# Patient Record
Sex: Female | Born: 1940 | Race: White | Hispanic: No | Marital: Single | Smoking: Never smoker
Health system: Southern US, Community
[De-identification: ages and names within clinical notes are randomized; demographics above are authoritative.]

## PROBLEM LIST (undated history)

## (undated) DIAGNOSIS — I10 Essential (primary) hypertension: Secondary | ICD-10-CM

## (undated) HISTORY — PX: NO PAST SURGERIES: SHX2092

---

## 2001-10-04 ENCOUNTER — Encounter: Payer: Self-pay | Admitting: Family Medicine

## 2001-10-04 ENCOUNTER — Ambulatory Visit (HOSPITAL_COMMUNITY): Admission: RE | Admit: 2001-10-04 | Discharge: 2001-10-04 | Payer: Self-pay | Admitting: Family Medicine

## 2020-10-11 ENCOUNTER — Encounter (HOSPITAL_BASED_OUTPATIENT_CLINIC_OR_DEPARTMENT_OTHER): Payer: Self-pay | Admitting: *Deleted

## 2020-10-11 ENCOUNTER — Emergency Department (HOSPITAL_BASED_OUTPATIENT_CLINIC_OR_DEPARTMENT_OTHER)
Admission: EM | Admit: 2020-10-11 | Discharge: 2020-10-11 | Disposition: A | Discharge status: Home / Self Care | Payer: Medicare Other | Attending: Emergency Medicine | Admitting: Emergency Medicine

## 2020-10-11 ENCOUNTER — Other Ambulatory Visit: Payer: Self-pay

## 2020-10-11 DIAGNOSIS — S0101XA Laceration without foreign body of scalp, initial encounter: Secondary | ICD-10-CM | POA: Diagnosis not present

## 2020-10-11 DIAGNOSIS — W228XXA Striking against or struck by other objects, initial encounter: Secondary | ICD-10-CM | POA: Diagnosis not present

## 2020-10-11 DIAGNOSIS — I10 Essential (primary) hypertension: Secondary | ICD-10-CM | POA: Insufficient documentation

## 2020-10-11 DIAGNOSIS — R0989 Other specified symptoms and signs involving the circulatory and respiratory systems: Secondary | ICD-10-CM | POA: Insufficient documentation

## 2020-10-11 DIAGNOSIS — S0990XA Unspecified injury of head, initial encounter: Secondary | ICD-10-CM | POA: Diagnosis present

## 2020-10-11 DIAGNOSIS — R059 Cough, unspecified: Secondary | ICD-10-CM

## 2020-10-11 HISTORY — DX: Essential (primary) hypertension: I10

## 2020-10-11 MED ORDER — LIDOCAINE-EPINEPHRINE (PF) 2 %-1:200000 IJ SOLN
10.0000 mL | Freq: Once | INTRAMUSCULAR | Status: AC
  Administered 2020-10-11: 10 mL via INTRADERMAL

## 2020-10-11 MED ORDER — DOXYCYCLINE HYCLATE 100 MG PO CAPS: 100.0000 mg | ORAL_CAPSULE | Freq: Two times a day (BID) | ORAL | 0 refills | Status: DC

## 2020-10-11 MED ORDER — LIDOCAINE-EPINEPHRINE (PF) 2 %-1:200000 IJ SOLN
INTRAMUSCULAR | Status: AC
  Filled 2020-10-11: qty 20

## 2020-10-11 NOTE — ED Notes (Signed)
Awaiting wound dressing via Tech to D/C home

## 2020-10-11 NOTE — ED Notes (Signed)
Returned to finish PT dressing but PT not present at this time

## 2020-10-11 NOTE — ED Notes (Signed)
ED Provider at bedside. 

## 2020-10-11 NOTE — ED Triage Notes (Signed)
Laceration to top of her head.  She stated that she hit her head on a granite countertop today.  She denies passing out.

## 2020-10-11 NOTE — ED Provider Notes (Signed)
[facility] EMERGENCY DEPARTMENT Provider Note  CSN: [id number] Arrival date & time: 10/11/20 1718    History Chief Complaint  Patient presents with  . Laceration    HPI  Cathy [name] is a 80 y.o. female with no significant PMH reports she was bending down to pick something up and when she stood up she hit her head on a granite countertop sustaining a large laceration to her scalp. No reported LOC. Complaining of moderate sharp pain. Patient has had a cough recently, tested negative for Covid, completed antibiotics without much improvement. No fever.    Past Medical History:  Diagnosis Date  . Hypertension     History reviewed. No pertinent surgical history.  No family history on file.  Social History   Tobacco Use  . Smoking status: Never Smoker  . Smokeless tobacco: Never Used  Substance Use Topics  . Alcohol use: Yes    Alcohol/week: 2.0 standard drinks    Types: 2 Glasses of wine per week    Comment: per day  . Drug use: Never     Home Medications Prior to Admission medications   Not on File     Allergies    Sulfa antibiotics   Review of Systems   Review of Systems A comprehensive review of systems was completed and negative except as noted in HPI.    Physical Exam BP 126/82 (BP Location: Left Arm)   Pulse 82   Temp 98 F (36.7 C) (Oral)   Resp 16   Ht 4\' 8"  (1.422 m)   Wt 54 kg   SpO2 97%   BMI 26.68 kg/m   Physical Exam Vitals and nursing note reviewed.  HENT:     Head: Normocephalic.     Comments: Large 18cm laceration to parietal scalp down to the cranium    Nose: Nose normal.  Eyes:     Extraocular Movements: Extraocular movements intact.  Pulmonary:     Effort: Pulmonary effort is normal.     Breath sounds: Rhonchi present.  Musculoskeletal:        General: Normal range of motion.     Cervical back: Neck supple.  Skin:    Findings: No rash (on exposed skin).  Neurological:     Mental Status: She is alert  and oriented to person, place, and time.  Psychiatric:        Mood and Affect: Mood normal.      ED Results / Procedures / Treatments   Labs (all labs ordered are listed, but only abnormal results are displayed) Labs Reviewed - No data to display  EKG None   Radiology No results found.  Procedures . Laceration Repair  Date/Time: 10/11/2020 7:26 PM Performed by: 10/13/2020, MD Authorized by: Pollyann Savoy, MD   Consent:    Consent obtained:  Verbal   Consent given by:  Patient   Risks discussed:  Infection, poor cosmetic result and poor wound healing Universal protocol:    Patient identity confirmed:  Verbally with patient Anesthesia:    Anesthesia method:  Local infiltration   Local anesthetic:  Lidocaine 2% WITH epi Laceration details:    Location:  Scalp   Scalp location:  Crown   Length (cm):  18 Pre-procedure details:    Preparation:  Patient was prepped and draped in usual sterile fashion Exploration:    Hemostasis achieved with:  Epinephrine   Wound exploration: entire depth of wound visualized     Wound extent comment:  Through galea Treatment:    Area cleansed with:  Saline   Irrigation solution:  Sterile water   Irrigation volume:  200   Irrigation method:  Pressure wash   Visualized foreign bodies/material removed: no     Layers/structures repaired:  Vernona Rieger:    Suture size:  4-0   Suture material:  Vicryl   Suture technique:  Simple interrupted   Number of sutures:  2 Skin repair:    Repair method:  Staples   Number of staples:  15 Approximation:    Approximation:  Close Repair type:    Repair type:  Intermediate Post-procedure details:    Dressing:  Non-adherent dressing   Procedure completion:  Tolerated well, no immediate complications    Medications Ordered in the ED Medications - No data to display   MDM Rules/Calculators/A&P MDM Large scalp wound. Given depth and extent of wound plan oral Abx. Given wound  care and follow up instructions.   ED Course  I have reviewed the triage vital signs and the nursing notes.  Pertinent labs & imaging results that were available during my care of the patient were reviewed by me and considered in my medical decision making (see chart for details).     Final Clinical Impression(s) / ED Diagnoses Final diagnoses:  Laceration of scalp, initial encounter    Rx / DC Orders ED Discharge Orders    None       Pollyann Savoy, MD 10/11/20 1934

## 2021-02-18 ENCOUNTER — Emergency Department (HOSPITAL_COMMUNITY): Payer: Medicare Other

## 2021-02-18 ENCOUNTER — Inpatient Hospital Stay (HOSPITAL_COMMUNITY)
Admission: EM | Admit: 2021-02-18 | Discharge: 2021-02-20 | DRG: 641 | Disposition: A | Discharge status: Home / Self Care | Payer: Medicare Other | Attending: Family Medicine | Admitting: Family Medicine

## 2021-02-18 DIAGNOSIS — S32810A Multiple fractures of pelvis with stable disruption of pelvic ring, initial encounter for closed fracture: Secondary | ICD-10-CM | POA: Diagnosis present

## 2021-02-18 DIAGNOSIS — E871 Hypo-osmolality and hyponatremia: Principal | ICD-10-CM | POA: Diagnosis present

## 2021-02-18 DIAGNOSIS — R262 Difficulty in walking, not elsewhere classified: Secondary | ICD-10-CM | POA: Diagnosis present

## 2021-02-18 DIAGNOSIS — I890 Lymphedema, not elsewhere classified: Secondary | ICD-10-CM

## 2021-02-18 DIAGNOSIS — Y92019 Unspecified place in single-family (private) house as the place of occurrence of the external cause: Secondary | ICD-10-CM

## 2021-02-18 DIAGNOSIS — L89151 Pressure ulcer of sacral region, stage 1: Secondary | ICD-10-CM | POA: Diagnosis present

## 2021-02-18 DIAGNOSIS — E875 Hyperkalemia: Secondary | ICD-10-CM | POA: Diagnosis present

## 2021-02-18 DIAGNOSIS — R55 Syncope and collapse: Secondary | ICD-10-CM | POA: Diagnosis present

## 2021-02-18 DIAGNOSIS — S32599A Other specified fracture of unspecified pubis, initial encounter for closed fracture: Secondary | ICD-10-CM | POA: Diagnosis not present

## 2021-02-18 DIAGNOSIS — I10 Essential (primary) hypertension: Secondary | ICD-10-CM | POA: Diagnosis present

## 2021-02-18 DIAGNOSIS — L89159 Pressure ulcer of sacral region, unspecified stage: Secondary | ICD-10-CM | POA: Diagnosis not present

## 2021-02-18 DIAGNOSIS — D649 Anemia, unspecified: Secondary | ICD-10-CM | POA: Diagnosis present

## 2021-02-18 DIAGNOSIS — Z79899 Other long term (current) drug therapy: Secondary | ICD-10-CM | POA: Diagnosis not present

## 2021-02-18 DIAGNOSIS — Z20822 Contact with and (suspected) exposure to covid-19: Secondary | ICD-10-CM | POA: Diagnosis present

## 2021-02-18 DIAGNOSIS — L89312 Pressure ulcer of right buttock, stage 2: Secondary | ICD-10-CM | POA: Diagnosis present

## 2021-02-18 DIAGNOSIS — R222 Localized swelling, mass and lump, trunk: Secondary | ICD-10-CM

## 2021-02-18 DIAGNOSIS — S2242XA Multiple fractures of ribs, left side, initial encounter for closed fracture: Secondary | ICD-10-CM | POA: Diagnosis not present

## 2021-02-18 DIAGNOSIS — L89322 Pressure ulcer of left buttock, stage 2: Secondary | ICD-10-CM | POA: Diagnosis present

## 2021-02-18 DIAGNOSIS — Z882 Allergy status to sulfonamides status: Secondary | ICD-10-CM | POA: Diagnosis not present

## 2021-02-18 DIAGNOSIS — S32591D Other specified fracture of right pubis, subsequent encounter for fracture with routine healing: Secondary | ICD-10-CM | POA: Diagnosis not present

## 2021-02-18 DIAGNOSIS — E861 Hypovolemia: Secondary | ICD-10-CM | POA: Diagnosis present

## 2021-02-18 DIAGNOSIS — W07XXXA Fall from chair, initial encounter: Secondary | ICD-10-CM | POA: Diagnosis present

## 2021-02-18 DIAGNOSIS — S2232XA Fracture of one rib, left side, initial encounter for closed fracture: Secondary | ICD-10-CM | POA: Diagnosis present

## 2021-02-18 DIAGNOSIS — R609 Edema, unspecified: Secondary | ICD-10-CM | POA: Diagnosis not present

## 2021-02-18 LAB — URINALYSIS, ROUTINE W REFLEX MICROSCOPIC
Bilirubin Urine: NEGATIVE
Glucose, UA: NEGATIVE mg/dL
Hgb urine dipstick: NEGATIVE
Ketones, ur: NEGATIVE mg/dL
Nitrite: NEGATIVE
Protein, ur: NEGATIVE mg/dL
Specific Gravity, Urine: 1.008 (ref 1.005–1.030)
pH: 6 (ref 5.0–8.0)

## 2021-02-18 LAB — COMPREHENSIVE METABOLIC PANEL
ALT: 19 U/L (ref 0–44)
AST: 32 U/L (ref 15–41)
Albumin: 3.5 g/dL (ref 3.5–5.0)
Alkaline Phosphatase: 133 U/L — ABNORMAL HIGH (ref 38–126)
Anion gap: 6 (ref 5–15)
BUN: 17 mg/dL (ref 8–23)
CO2: 24 mmol/L (ref 22–32)
Calcium: 8.9 mg/dL (ref 8.9–10.3)
Chloride: 93 mmol/L — ABNORMAL LOW (ref 98–111)
Creatinine, Ser: 0.94 mg/dL (ref 0.44–1.00)
GFR, Estimated: >60 mL/min (ref >60)
Glucose, Bld: 94 mg/dL (ref 70–99)
Potassium: 5.2 mmol/L — ABNORMAL HIGH (ref 3.5–5.1)
Sodium: 123 mmol/L — ABNORMAL LOW (ref 135–145)
Total Bilirubin: 0.7 mg/dL (ref 0.3–1.2)
Total Protein: 7.4 g/dL (ref 6.5–8.1)

## 2021-02-18 LAB — CBC WITH DIFFERENTIAL/PLATELET
Abs Immature Granulocytes: 0.06 10*3/uL (ref 0.00–0.07)
Basophils Absolute: 0.1 10*3/uL (ref 0.0–0.1)
Basophils Relative: 1 %
Eosinophils Absolute: 0.1 10*3/uL (ref 0.0–0.5)
Eosinophils Relative: 2 %
HCT: 34.9 % — ABNORMAL LOW (ref 36.0–46.0)
Hemoglobin: 11.7 g/dL — ABNORMAL LOW (ref 12.0–15.0)
Immature Granulocytes: 1 %
Lymphocytes Relative: 20 %
Lymphs Abs: 1.2 10*3/uL (ref 0.7–4.0)
MCH: 30.5 pg (ref 26.0–34.0)
MCHC: 33.5 g/dL (ref 30.0–36.0)
MCV: 90.9 fL (ref 80.0–100.0)
Monocytes Absolute: 0.8 10*3/uL (ref 0.1–1.0)
Monocytes Relative: 14 %
Neutro Abs: 3.8 10*3/uL (ref 1.7–7.7)
Neutrophils Relative %: 62 %
Platelets: 294 10*3/uL (ref 150–400)
RBC: 3.84 MIL/uL — ABNORMAL LOW (ref 3.87–5.11)
RDW: 12.3 % (ref 11.5–15.5)
WBC: 6 10*3/uL (ref 4.0–10.5)
nRBC: 0 % (ref 0.0–0.2)

## 2021-02-18 LAB — BASIC METABOLIC PANEL
Anion gap: 6 (ref 5–15)
BUN: 14 mg/dL (ref 8–23)
CO2: 25 mmol/L (ref 22–32)
Calcium: 8.7 mg/dL — ABNORMAL LOW (ref 8.9–10.3)
Chloride: 94 mmol/L — ABNORMAL LOW (ref 98–111)
Creatinine, Ser: 0.87 mg/dL (ref 0.44–1.00)
GFR, Estimated: >60 mL/min (ref >60)
Glucose, Bld: 126 mg/dL — ABNORMAL HIGH (ref 70–99)
Potassium: 5 mmol/L (ref 3.5–5.1)
Sodium: 125 mmol/L — ABNORMAL LOW (ref 135–145)

## 2021-02-18 LAB — RESP PANEL BY RT-PCR (FLU A&B, COVID) ARPGX2
Influenza A by PCR: NEGATIVE
Influenza B by PCR: NEGATIVE
SARS Coronavirus 2 by RT PCR: NEGATIVE

## 2021-02-18 LAB — D-DIMER, QUANTITATIVE: D-Dimer, Quant: 2.33 ug/mL-FEU — ABNORMAL HIGH (ref 0.00–0.50)

## 2021-02-18 LAB — MAGNESIUM: Magnesium: 2 mg/dL (ref 1.7–2.4)

## 2021-02-18 LAB — RAPID URINE DRUG SCREEN, HOSP PERFORMED
Amphetamines: NOT DETECTED
Barbiturates: NOT DETECTED
Benzodiazepines: NOT DETECTED
Cocaine: NOT DETECTED
Opiates: NOT DETECTED
Tetrahydrocannabinol: NOT DETECTED

## 2021-02-18 LAB — TROPONIN I (HIGH SENSITIVITY): Troponin I (High Sensitivity): 5 ng/L (ref <18)

## 2021-02-18 LAB — ACETAMINOPHEN LEVEL: Acetaminophen (Tylenol), Serum: <10 ug/mL — ABNORMAL LOW (ref 10–30)

## 2021-02-18 LAB — C-REACTIVE PROTEIN: CRP: 0.5 mg/dL (ref <1.0)

## 2021-02-18 LAB — CBG MONITORING, ED: Glucose-Capillary: 108 mg/dL — ABNORMAL HIGH (ref 70–99)

## 2021-02-18 MED ORDER — ACETAMINOPHEN 325 MG PO TABS
650.0000 mg | ORAL_TABLET | Freq: Four times a day (QID) | ORAL | Status: DC | PRN
  Administered 2021-02-18: 650 mg via ORAL
  Filled 2021-02-18: qty 2

## 2021-02-18 MED ORDER — ACETAMINOPHEN 650 MG RE SUPP: 650.0000 mg | Freq: Four times a day (QID) | RECTAL | Status: DC | PRN

## 2021-02-18 MED ORDER — METOPROLOL SUCCINATE ER 25 MG PO TB24
25.0000 mg | ORAL_TABLET | Freq: Every day | ORAL | Status: DC
  Administered 2021-02-19 – 2021-02-20 (×2): 25 mg via ORAL
  Filled 2021-02-18 (×2): qty 1

## 2021-02-18 MED ORDER — METOPROLOL SUCCINATE 25 MG PO CS24: 1.0000 | EXTENDED_RELEASE_CAPSULE | Freq: Every day | ORAL | Status: DC

## 2021-02-18 MED ORDER — ONDANSETRON HCL 4 MG PO TABS: 4.0000 mg | ORAL_TABLET | Freq: Four times a day (QID) | ORAL | Status: DC | PRN

## 2021-02-18 MED ORDER — LISINOPRIL 20 MG PO TABS
20.0000 mg | ORAL_TABLET | Freq: Every day | ORAL | Status: DC
  Administered 2021-02-19 – 2021-02-20 (×2): 20 mg via ORAL
  Filled 2021-02-18 (×2): qty 1

## 2021-02-18 MED ORDER — ALBUTEROL SULFATE (2.5 MG/3ML) 0.083% IN NEBU
2.5000 mg | INHALATION_SOLUTION | Freq: Four times a day (QID) | RESPIRATORY_TRACT | Status: DC
  Administered 2021-02-18: 2.5 mg via RESPIRATORY_TRACT
  Filled 2021-02-18: qty 3

## 2021-02-18 MED ORDER — LISINOPRIL 20 MG PO TABS: 20.0000 mg | ORAL_TABLET | Freq: Every day | ORAL | Status: DC

## 2021-02-18 MED ORDER — SODIUM CHLORIDE 0.9 % IV SOLN: INTRAVENOUS | Status: DC

## 2021-02-18 MED ORDER — ENOXAPARIN SODIUM 40 MG/0.4ML IJ SOSY
40.0000 mg | PREFILLED_SYRINGE | INTRAMUSCULAR | Status: DC
  Administered 2021-02-18 – 2021-02-19 (×2): 40 mg via SUBCUTANEOUS
  Filled 2021-02-18 (×2): qty 0.4

## 2021-02-18 MED ORDER — HYDROCODONE-ACETAMINOPHEN 5-325 MG PO TABS: 1.0000 | ORAL_TABLET | Freq: Four times a day (QID) | ORAL | Status: DC | PRN

## 2021-02-18 MED ORDER — ONDANSETRON HCL 4 MG/2ML IJ SOLN: 4.0000 mg | Freq: Four times a day (QID) | INTRAMUSCULAR | Status: DC | PRN

## 2021-02-18 NOTE — H&P (Signed)
History and Physical    Cathy [name] XLK:[id number] DOB: 11/17/1940 DOA: 02/18/2021  Referring MD/NP/PA: Louretta ParmaKendall Stewardson, DO (resident)  PCP: Darrin [facility], [facility] [place]  [place] [facility]  Chief Complaint: Dizziness  I have personally briefly reviewed patient[facility]   HPI: Cathy AlexanderMargaret C Corkery is a [age] y.o. female with medical history significant of hypertension and lymphedema who presents with complaints of dizziness.  Symptoms started after she had had breakfast this morning and she felt lightheaded.  Denies having any loss of consciousness, chest pain, or palpations.  Patient admits that she had slipped off of the edge of the couch approximately 2- 3 weeks ago and landed on her tailbone.  Denied having any loss of consciousness and she was not evaluated for this.  Since that time patient reports that her tailbone has been hurting her and she was having pain with walking using her walker.  Patient has been using Tylenol every 6 hours as she still help with symptoms.  Notes that she is very sensitive to any kind of pain medications and has been doing fine with just Tylenol.  She has lymphedema, but reported that her legs appeared more swollen here recently.  Son reports that since she had fallen she is not getting up and moving around as much and have been sitting on a heating pad most of the day. There have been reports of patient being confused, but upon EMS arrival patient was alert and oriented x4.  ED Course: Upon admission to the emergency department patient was noted to be afebrile with respirations 15-24, and all other vital signs relatively maintained.  Labs significant for hemoglobin 11.7, sodium 123, potassium 5.2, BUN 17, creatinine 0.94, alkaline phosphatase 133, and high-sensitivity troponin 5.  Influenza and COVID-19 screening was negative.  Orthopedics have been consulted.  TRH called to admit.  Review of  Systems  Constitutional: Negative for diaphoresis and fever.  HENT: Negative for nosebleeds and sinus pain.   Eyes: Negative for photophobia and pain.  Respiratory: Negative for cough and shortness of breath.   Cardiovascular: Positive for leg swelling. Negative for chest pain.  Gastrointestinal: Negative for abdominal pain, nausea and vomiting.  Genitourinary: Negative for dysuria and hematuria.  Musculoskeletal: Positive for falls and joint pain.  Skin: Negative for rash.  Neurological: Positive for dizziness. Negative for loss of consciousness and headaches.  Psychiatric/Behavioral: Negative for memory loss and substance abuse.    Past Medical History:  Diagnosis Date  . Hypertension     No past surgical history on file.   reports that she has never smoked. She has never used smokeless tobacco. She reports current alcohol use of about 2.0 standard drinks of alcohol per week. She reports that she does not use drugs.  Allergies  Allergen Reactions  . Sulfa Antibiotics Hives  . Sulfamethoxazole-Trimethoprim Rash    No family history on file.  Prior to Admission medications   Medication Sig Start Date End Date Taking? Authorizing Provider  furosemide (LASIX) 20 MG tablet Take 20 mg by mouth every other day. 01/20/21  Yes [provider]  KAPSPARGO SPRINKLE 25 MG CS24 Take 1 capsule by mouth daily. 10/31/20  Yes [provider]  lisinopril (ZESTRIL) 20 MG tablet Take 1 tablet by mouth daily. 12/22/20  Yes [provider]  potassium chloride (KLOR-CON) 10 MEQ tablet Take 10 mEq by mouth daily. 12/15/20  Yes [provider]  doxycycline (VIBRAMYCIN) 100 MG capsule  Take 1 capsule (100 mg total) by mouth 2 (two) times daily. Patient not taking: Reported on 02/18/2021 10/11/20   Pollyann Savoy, MD    Physical Exam:  Constitutional: elderly NAD, calm, comfortable Vitals:   02/18/21 1335 02/18/21 1345 02/18/21 1500 02/18/21 1515  BP: (!) 115/59  125/79 (!) 153/91 (!) 160/73  Pulse: 64 72 76 74  Resp: (!) 23 (!) Temp:      TempSrc:      SpO2: 99% 100% 100% 100%  Weight:      Height:       Eyes: PERRL, lids and conjunctivae normal ENMT: Mucous membranes are moist. Posterior pharynx clear of any exudate or lesions.Normal dentition.  Neck: normal, supple, no masses, no thyromegaly Respiratory: clear to auscultation bilaterally, no wheezing, no crackles. Normal respiratory effort. No accessory muscle use.  Cardiovascular: Regular rate and rhythm, no murmurs / rubs / gallops. No extremity edema. 2+ pedal pulses. No carotid bruits.  Abdomen: no tenderness, no masses palpated. No hepatosplenomegaly. Bowel sounds positive.  Musculoskeletal: no clubbing / cyanosis. No joint deformity upper and lower extremities. Good ROM, no contractures. Normal muscle tone.  Skin: no rashes, lesions, ulcers. No induration Neurologic: CN 2-12 grossly intact. Sensation intact, DTR normal. Strength 5/5 in all 4.  Psychiatric: Normal judgment and insight. Alert and oriented x 3. Normal mood.     Labs on Admission: I have personally reviewed following labs and imaging studies  CBC: Recent Labs  Lab 02/18/21 1217  WBC 6.0  NEUTROABS 3.8  HGB 11.7*  HCT 34.9*  MCV 90.9  PLT 294   Basic Metabolic Panel: Recent Labs  Lab 02/18/21 1217  NA 123*  K 5.2*  CL 93*  CO2 24  GLUCOSE 94  BUN 17  CREATININE 0.94  CALCIUM 8.9  MG 2.0   GFR: Estimated Creatinine Clearance: 36 mL/min (by C-G formula based on SCr of 0.94 mg/dL). Liver Function Tests: Recent Labs  Lab 02/18/21 1217  AST 32  ALT 19  ALKPHOS 133*  BILITOT 0.7  PROT 7.4  ALBUMIN 3.5   No results for input(s): LIPASE, AMYLASE in the last 168 hours. No results for input(s): AMMONIA in the last 168 hours. Coagulation Profile: No results for input(s): INR, PROTIME in the last 168 hours. Cardiac Enzymes: No results for input(s): CKTOTAL, CKMB, CKMBINDEX, TROPONINI in the  last 168 hours. BNP (last 3 results) No results for input(s): PROBNP in the last 8760 hours. HbA1C: No results for input(s): HGBA1C in the last 72 hours. CBG: Recent Labs  Lab 02/18/21 1203  GLUCAP 108*   Lipid Profile: No results for input(s): CHOL, HDL, LDLCALC, TRIG, CHOLHDL, LDLDIRECT in the last 72 hours. Thyroid Function Tests: No results for input(s): TSH, T4TOTAL, FREET4, T3FREE, THYROIDAB in the last 72 hours. Anemia Panel: No results for input(s): VITAMINB12, FOLATE, FERRITIN, TIBC, IRON, RETICCTPCT in the last 72 hours. Urine analysis: No results found for: COLORURINE, APPEARANCEUR, LABSPEC, PHURINE, GLUCOSEU, HGBUR, BILIRUBINUR, KETONESUR, PROTEINUR, UROBILINOGEN, NITRITE, LEUKOCYTESUR Sepsis Labs: Recent Results (from the past 240 hour(s))  Resp Panel by RT-PCR (Flu A&B, Covid) Nasopharyngeal Swab     Status: None   Collection Time: 02/18/21 12:27 PM   Specimen: Nasopharyngeal Swab; Nasopharyngeal(NP) swabs in vial transport medium  Result Value Ref Range Status   SARS Coronavirus 2 by RT PCR NEGATIVE NEGATIVE Final    Comment: (NOTE) SARS-CoV-2 target nucleic acids are NOT DETECTED.  The SARS-CoV-2 RNA is generally detectable in upper respiratory specimens  during the acute phase of infection. The lowest concentration of SARS-CoV-2 viral copies this assay can detect is 138 copies/mL. A negative result does not preclude SARS-Cov-2 infection and should not be used as the sole basis for treatment or other patient management decisions. A negative result may occur with  improper specimen collection/handling, submission of specimen other than nasopharyngeal swab, presence of viral mutation(s) within the areas targeted by this assay, and inadequate number of viral copies(<138 copies/mL). A negative result must be combined with clinical observations, patient history, and epidemiological information. The expected result is Negative.  Fact Sheet for Patients:   BloggerCourse.com  Fact Sheet for Healthcare Providers:  SeriousBroker.it  This test is no t yet approved or cleared by the [place] FDA and  has been authorized for detection and/or diagnosis of SARS-CoV-2 by FDA under an Emergency Use Authorization (EUA). This EUA will remain  in effect (meaning this test can be used) for the duration of the COVID-19 declaration under Section 564(b)(1) of the Act, 21 U.S.C.section 360bbb-3(b)(1), unless the authorization is terminated  or revoked sooner.       Influenza A by PCR NEGATIVE NEGATIVE Final   Influenza B by PCR NEGATIVE NEGATIVE Final    Comment: (NOTE) The Xpert Xpress SARS-CoV-2/FLU/RSV plus assay is intended as an aid in the diagnosis of influenza from Nasopharyngeal swab specimens and should not be used as a sole basis for treatment. Nasal washings and aspirates are unacceptable for Xpert Xpress SARS-CoV-2/FLU/RSV testing.  Fact Sheet for Patients: BloggerCourse.com  Fact Sheet for Healthcare Providers: SeriousBroker.it  This test is not yet approved or cleared by the [place] FDA and has been authorized for detection and/or diagnosis of SARS-CoV-2 by FDA under an Emergency Use Authorization (EUA). This EUA will remain in effect (meaning this test can be used) for the duration of the COVID-19 declaration under Section 564(b)(1) of the Act, 21 U.S.C. section 360bbb-3(b)(1), unless the authorization is terminated or revoked.  Performed at [facility], [address] [place], [place] [address]      Radiological Exams on Admission: CT Head Wo Contrast  Result Date: 02/18/2021 CLINICAL DATA:  Mental status change EXAM: CT HEAD WITHOUT CONTRAST TECHNIQUE: Contiguous axial images were obtained from the base of the skull through the vertex without intravenous contrast. COMPARISON:  None. FINDINGS: Brain:  Moderate cerebral atrophy. Ventricular enlargement consistent with the level of atrophy. Moderate white matter hypodensity bilaterally is symmetric. Negative for acute infarct, acute hemorrhage, mass Vascular: Negative for hyperdense vessel Skull: Negative Sinuses/Orbits: The paranasal sinuses are clear.  Negative orbit Other: None IMPRESSION: No acute abnormality Moderate atrophy and moderate chronic microvascular ischemic changes in the white matter Electronically Signed   By: Marlan Palau M.D.   On: 02/18/2021 13:34   DG Pelvis Portable  Result Date: 02/18/2021 CLINICAL DATA:  Fall 2 weeks ago EXAM: PORTABLE PELVIS 1-2 VIEWS COMPARISON:  None. FINDINGS: Moderately displaced fracture of the right superior pubic ramus and parasymphyseal aspect of the right pubic bone. Nondisplaced fracture of the right inferior pubic ramus. Possible nondisplaced fracture involving the parasymphyseal aspect of the left pubic bone. Pubic symphysis intact without diastasis. SI joints appear intact without diastasis. No hip fracture or dislocation. IMPRESSION: 1. Moderately displaced fracture of the right superior pubic ramus and parasymphyseal aspect of the right pubic bone. 2. Nondisplaced fracture of the right inferior pubic ramus. 3. Possible nondisplaced fracture involving the parasymphyseal aspect of the left pubic bone. Electronically Signed   By: Janyth Pupa  Plundo D.O.   On: 02/18/2021 12:55   DG Chest Portable 1 View  Result Date: 02/18/2021 CLINICAL DATA:  Lightheadedness. EXAM: PORTABLE CHEST 1 VIEW COMPARISON:  None. FINDINGS: The heart size and mediastinal contours are within normal limits. Masslike opacity at the right lung apex may be secondary to overlap of the patient's head/neck in this region. Otherwise, no consolidation. No visible pleural effusions or pneumothorax on the single AP semi erect radiograph. No acute osseous abnormality. IMPRESSION: 1. Masslike opacity at the right lung apex may be secondary to  overlap of the patient's head/neck in this region; however, recommend dedicated PA and lateral radiographs with extension of the neck to better evaluate this area and exclude a mass. 2. Otherwise, no acute cardiopulmonary disease. Electronically Signed   By: Feliberto Harts MD   On: 02/18/2021 13:00   CT CHEST ABDOMEN PELVIS WO CONTRAST  Result Date: 02/18/2021 CLINICAL DATA:  Fall.  Dizziness. EXAM: CT CHEST, ABDOMEN AND PELVIS WITHOUT CONTRAST TECHNIQUE: Multidetector CT imaging of the chest, abdomen and pelvis was performed following the standard protocol without IV contrast. COMPARISON:  Radiograph of same day. FINDINGS: CT CHEST FINDINGS Cardiovascular: Atherosclerosis of thoracic aorta is noted without aneurysm formation. Normal cardiac size. No pericardial effusion. Mild coronary artery calcifications are noted. Mediastinum/Nodes: Small sliding-type hiatal hernia is noted. No adenopathy is noted. Thyroid gland is unremarkable. Lungs/Pleura: Lungs are clear. No pleural effusion or pneumothorax. Musculoskeletal: Minimally displaced fractures are seen involving the lateral portion of the left seventh and eighth ribs. CT ABDOMEN PELVIS FINDINGS Hepatobiliary: No focal liver abnormality is seen. No gallstones, gallbladder wall thickening, or biliary dilatation. Pancreas: Unremarkable. No pancreatic ductal dilatation or surrounding inflammatory changes. Spleen: Normal in size without focal abnormality. Adrenals/Urinary Tract: Adrenal glands are unremarkable. Kidneys are normal, without renal calculi, focal lesion, or hydronephrosis. Bladder is unremarkable. Stomach/Bowel: Stomach is within normal limits. Appendix appears normal. No evidence of bowel wall thickening, distention, or inflammatory changes. Sigmoid diverticulosis is noted without inflammation. Vascular/Lymphatic: Aortic atherosclerosis. No enlarged abdominal or pelvic lymph nodes. Reproductive: Uterus and bilateral adnexa are unremarkable. Other:  No abdominal wall hernia or abnormality. No abdominopelvic ascites. Musculoskeletal: Mildly displaced fractures are seen involving the right superior and inferior pubic rami. Nondisplaced left parasymphyseal pubic fracture is noted. IMPRESSION: Minimally displaced fractures are seen involving the left seventh and eighth ribs. Mildly displaced fractures are seen involving the right superior and inferior pubic rami. Nondisplaced left parasymphyseal pubic fracture is noted. Small sliding-type hiatal hernia. Mild coronary artery calcifications are noted. Sigmoid diverticulosis is noted without inflammation. Aortic Atherosclerosis (ICD10-I70.0). Electronically Signed   By: Lupita Raider M.D.   On: 02/18/2021 14:40    EKG: Independently reviewed.  Sinus rhythm at 77 bpm  Assessment/Plan Hyponatremia: Acute.  Patient presents with sodium of 123.  She is on furosemide every other day.  Suspecting a hypovolemic hyponatremia as cause of patient's symptoms. -Admit to medical -Hold furosemide -Check urine sodium and urine osmolarity -Normal saline IV fluids at 75 mL/h -Goal correction no more than 10 mmol/L in a 24-hour period  -Adjust IV fluids as needed  Pubic ramus and rib fractures secondary to fall: Patient had fallen approximately 2 weeks ago and had been having difficulty getting around since that time.  Found to have minimally displaced fractures involving seventh and eighth rib, right superior and inferior pubic rami, nondisplaced left symphyseal pubic fracture.  Patient had only been taking Tylenol as needed at home and reports having serious reactions to any  other pain medication.  The patient's son had already arranged for the patient to have [facility] come in for physical therapy.  Orthopedics had been consulted and recommended weightbearing as tolerated to follow-up with Dr. Aundria Rud in the office in 3 weeks. -Check acetaminophen level -Incentive spirometry -Tylenol as needed for  pain -PT/OT to eval and treat in a.m. -Social work consulted   Hyperkalemia: Acute.  Initial potassium mildly elevated at 5.3. -Hold potassium supplement -Normal saline IV fluids as noted above -Recheck sodium levels  Normocytic anemia: Hemoglobin 11.7 g/dL.  Patient reports no complaints of bleeding. -Recheck CBC in a.m.  Lymphedema: Chronic.  Patient reports a chronic history of lymphedema, but notes that he has been more swollen recently.  She had been more sedentary following her fall. -Check CRP and D-dimer -Consider need of venous Doppler ultrasound bilateral lower extremity  Essential hypertension: Blood pressure currently stable. -Continue metoprolol and lisinopril tomorrow morning.  Consider holding lisinopril if potassium levels continue to rise  Stage I sacral pressure ulcer -Low-air-loss mattress replacement -Pressure dressing to sacrum  DVT prophylaxis: lovenox   Code Status:Full Family Communication: Son updated over the phone Disposition Plan: TBD  Consults called: Orthopedics Admission status: inpatient, require more than 2 midnight stay for correction of hyponatremia.  Clydie Braun MD [facility]   If 7PM-7AM, please contact night-coverage   02/18/2021, 3:22 PM

## 2021-02-18 NOTE — ED Provider Notes (Signed)
[facility] EMERGENCY DEPARTMENT Provider Note   CSN: [id number] Arrival date & time: 02/18/21  1142     History Chief Complaint  Patient presents with  . Dizziness  . Altered Mental Status    KARLEIGH BUNTE is a 80 y.o. female.  HPI 80 year old female with history of hypertension presents the emergency department for evaluation after lightheadedness and confusion.  She states she was sitting to eat breakfast, after she was done she felt like she was going to pass out.  Did not feel like the room was spinning.  States this lasted for about an hour, and is now mostly resolved.  Denies history of anything of this previously.  Nothing made his symptoms better, nothing made it worse.  Did not take anything for the symptoms.  States it was moderate lightheadedness.  Denies loss of consciousness, chest pain, shortness of breath, fever, cough, urinary symptoms, abdominal pain, bowel symptoms.  Patient does report falling out of a chair 2 weeks ago, denies hitting her head.  States she fell onto her tailbone and has really had trouble walking with her walker since that time.  States it hurts worst over her sacrum, like when she broke her tailbone.  EMS did find a wound that patient did not know she had overlying the area.  Per EMS, facility reported that she was slower to answer and a little bit more confused than normal.  Baseline is usually able to answer all questions.  Currently, she is alert and oriented x3, though did take her meant to remember what year it was.    Past Medical History:  Diagnosis Date  . Hypertension     There are no problems to display for this patient.   No past surgical history on file.   OB History    Gravida  2   Para  2   Term      Preterm      AB      Living        SAB      IAB      Ectopic      Multiple      Live Births              No family history on file.  Social History   Tobacco Use  . Smoking  status: Never Smoker  . Smokeless tobacco: Never Used  Substance Use Topics  . Alcohol use: Yes    Alcohol/week: 2.0 standard drinks    Types: 2 Glasses of wine per week    Comment: per day  . Drug use: Never    Home Medications Prior to Admission medications   Medication Sig Start Date End Date Taking? Authorizing Provider  furosemide (LASIX) 20 MG tablet Take 20 mg by mouth every other day. 01/20/21  Yes [provider]  KAPSPARGO SPRINKLE 25 MG CS24 Take 1 capsule by mouth daily. 10/31/20  Yes [provider]  lisinopril (ZESTRIL) 20 MG tablet Take 1 tablet by mouth daily. 12/22/20  Yes [provider]  potassium chloride (KLOR-CON) 10 MEQ tablet Take 10 mEq by mouth daily. 12/15/20  Yes [provider]  doxycycline (VIBRAMYCIN) 100 MG capsule Take 1 capsule (100 mg total) by mouth 2 (two) times daily. Patient not taking: Reported on 02/18/2021 10/11/20   Pollyann Savoy, MD    Allergies    Sulfa antibiotics and Sulfamethoxazole-trimethoprim  Review of Systems   Review of Systems  Constitutional: Negative for  chills and fever.  HENT: Negative for ear pain and sore throat.   Eyes: Negative for pain and visual disturbance.  Respiratory: Negative for cough and shortness of breath.   Cardiovascular: Negative for chest pain and palpitations.  Gastrointestinal: Negative for abdominal pain and vomiting.  Genitourinary: Negative for dysuria and hematuria.  Musculoskeletal: Positive for arthralgias. Negative for back pain.  Skin: Positive for wound. Negative for color change and rash.  Neurological: Positive for light-headedness. Negative for seizures and syncope.  Psychiatric/Behavioral: Negative for sleep disturbance.  All other systems reviewed and are negative.   Physical Exam Updated Vital Signs BP (!) 160/73   Pulse 74   Temp (!) 97.5 F (36.4 C) (Oral)   Resp 19   Ht 5' (1.524 m)   Wt 54.4 kg   SpO2 100%   BMI 23.44 kg/m   Physical  Exam Vitals and nursing note reviewed.  Constitutional:      General: She is not in acute distress.    Appearance: She is well-developed.  HENT:     Head: Normocephalic and atraumatic.     Right Ear: External ear normal.     Left Ear: External ear normal.     Nose: Nose normal.     Mouth/Throat:     Mouth: Mucous membranes are moist.  Eyes:     Extraocular Movements: Extraocular movements intact.     Conjunctiva/sclera: Conjunctivae normal.     Pupils: Pupils are equal, round, and reactive to light.  Neck:     Vascular: No carotid bruit.  Cardiovascular:     Rate and Rhythm: Normal rate and regular rhythm.     Heart sounds: Normal heart sounds. No murmur heard.   Pulmonary:     Effort: Pulmonary effort is normal. No respiratory distress.     Breath sounds: Normal breath sounds.  Abdominal:     General: Abdomen is flat.     Palpations: Abdomen is soft.     Tenderness: There is no abdominal tenderness. There is no guarding or rebound.  Musculoskeletal:        General: Normal range of motion.     Cervical back: Normal range of motion and neck supple.     Right lower leg: Edema present.     Left lower leg: Edema present.     Comments: Has bilateral lower extremity edema with chronic appearing overlying induration and erythema without warmth.  Erythema over right dorsal foot without fluctuance or warmth, patient reports in conjunction with the way her shoe rubs on that foot.  Skin:    General: Skin is warm and dry.     Capillary Refill: Capillary refill takes less than 2 seconds.     Comments: Erythema on either side of gluteal crease running up and down with shallow erosions, no overlying warmth, fluctuance, or induration.  Presacral erythema.  Neurological:     General: No focal deficit present.     Mental Status: She is alert and oriented to person, place, and time.     Cranial Nerves: No cranial nerve deficit.     Sensory: No sensory deficit.     Coordination: Coordination  normal.     Comments: Strength observed in bilateral upper and lower extremity with symmetric sensation.  Psychiatric:        Mood and Affect: Mood normal.        Behavior: Behavior normal.     ED Results / Procedures / Treatments   Labs (all labs ordered are listed,  but only abnormal results are displayed) Labs Reviewed  CBC WITH DIFFERENTIAL/PLATELET - Abnormal; Notable for the following components:      Result Value   RBC 3.84 (*)    Hemoglobin 11.7 (*)    HCT 34.9 (*)    All other components within normal limits  COMPREHENSIVE METABOLIC PANEL - Abnormal; Notable for the following components:   Sodium 123 (*)    Potassium 5.2 (*)    Chloride 93 (*)    Alkaline Phosphatase 133 (*)    All other components within normal limits  CBG MONITORING, ED - Abnormal; Notable for the following components:   Glucose-Capillary 108 (*)    All other components within normal limits  RESP PANEL BY RT-PCR (FLU A&B, COVID) ARPGX2  MAGNESIUM  URINALYSIS, ROUTINE W REFLEX MICROSCOPIC  RAPID URINE DRUG SCREEN, HOSP PERFORMED  TROPONIN I (HIGH SENSITIVITY)    EKG None  Radiology CT Head Wo Contrast  Result Date: 02/18/2021 CLINICAL DATA:  Mental status change EXAM: CT HEAD WITHOUT CONTRAST TECHNIQUE: Contiguous axial images were obtained from the base of the skull through the vertex without intravenous contrast. COMPARISON:  None. FINDINGS: Brain: Moderate cerebral atrophy. Ventricular enlargement consistent with the level of atrophy. Moderate white matter hypodensity bilaterally is symmetric. Negative for acute infarct, acute hemorrhage, mass Vascular: Negative for hyperdense vessel Skull: Negative Sinuses/Orbits: The paranasal sinuses are clear.  Negative orbit Other: None IMPRESSION: No acute abnormality Moderate atrophy and moderate chronic microvascular ischemic changes in the white matter Electronically Signed   By: Marlan Palau M.D.   On: 02/18/2021 13:34   DG Pelvis Portable  Result  Date: 02/18/2021 CLINICAL DATA:  Fall 2 weeks ago EXAM: PORTABLE PELVIS 1-2 VIEWS COMPARISON:  None. FINDINGS: Moderately displaced fracture of the right superior pubic ramus and parasymphyseal aspect of the right pubic bone. Nondisplaced fracture of the right inferior pubic ramus. Possible nondisplaced fracture involving the parasymphyseal aspect of the left pubic bone. Pubic symphysis intact without diastasis. SI joints appear intact without diastasis. No hip fracture or dislocation. IMPRESSION: 1. Moderately displaced fracture of the right superior pubic ramus and parasymphyseal aspect of the right pubic bone. 2. Nondisplaced fracture of the right inferior pubic ramus. 3. Possible nondisplaced fracture involving the parasymphyseal aspect of the left pubic bone. Electronically Signed   By: Duanne Guess D.O.   On: 02/18/2021 12:55   DG Chest Portable 1 View  Result Date: 02/18/2021 CLINICAL DATA:  Lightheadedness. EXAM: PORTABLE CHEST 1 VIEW COMPARISON:  None. FINDINGS: The heart size and mediastinal contours are within normal limits. Masslike opacity at the right lung apex may be secondary to overlap of the patient's head/neck in this region. Otherwise, no consolidation. No visible pleural effusions or pneumothorax on the single AP semi erect radiograph. No acute osseous abnormality. IMPRESSION: 1. Masslike opacity at the right lung apex may be secondary to overlap of the patient's head/neck in this region; however, recommend dedicated PA and lateral radiographs with extension of the neck to better evaluate this area and exclude a mass. 2. Otherwise, no acute cardiopulmonary disease. Electronically Signed   By: Feliberto Harts MD   On: 02/18/2021 13:00   CT CHEST ABDOMEN PELVIS WO CONTRAST  Result Date: 02/18/2021 CLINICAL DATA:  Fall.  Dizziness. EXAM: CT CHEST, ABDOMEN AND PELVIS WITHOUT CONTRAST TECHNIQUE: Multidetector CT imaging of the chest, abdomen and pelvis was performed following the  standard protocol without IV contrast. COMPARISON:  Radiograph of same day. FINDINGS: CT CHEST FINDINGS Cardiovascular: Atherosclerosis of  thoracic aorta is noted without aneurysm formation. Normal cardiac size. No pericardial effusion. Mild coronary artery calcifications are noted. Mediastinum/Nodes: Small sliding-type hiatal hernia is noted. No adenopathy is noted. Thyroid gland is unremarkable. Lungs/Pleura: Lungs are clear. No pleural effusion or pneumothorax. Musculoskeletal: Minimally displaced fractures are seen involving the lateral portion of the left seventh and eighth ribs. CT ABDOMEN PELVIS FINDINGS Hepatobiliary: No focal liver abnormality is seen. No gallstones, gallbladder wall thickening, or biliary dilatation. Pancreas: Unremarkable. No pancreatic ductal dilatation or surrounding inflammatory changes. Spleen: Normal in size without focal abnormality. Adrenals/Urinary Tract: Adrenal glands are unremarkable. Kidneys are normal, without renal calculi, focal lesion, or hydronephrosis. Bladder is unremarkable. Stomach/Bowel: Stomach is within normal limits. Appendix appears normal. No evidence of bowel wall thickening, distention, or inflammatory changes. Sigmoid diverticulosis is noted without inflammation. Vascular/Lymphatic: Aortic atherosclerosis. No enlarged abdominal or pelvic lymph nodes. Reproductive: Uterus and bilateral adnexa are unremarkable. Other: No abdominal wall hernia or abnormality. No abdominopelvic ascites. Musculoskeletal: Mildly displaced fractures are seen involving the right superior and inferior pubic rami. Nondisplaced left parasymphyseal pubic fracture is noted. IMPRESSION: Minimally displaced fractures are seen involving the left seventh and eighth ribs. Mildly displaced fractures are seen involving the right superior and inferior pubic rami. Nondisplaced left parasymphyseal pubic fracture is noted. Small sliding-type hiatal hernia. Mild coronary artery calcifications are  noted. Sigmoid diverticulosis is noted without inflammation. Aortic Atherosclerosis (ICD10-I70.0). Electronically Signed   By: Lupita Raider M.D.   On: 02/18/2021 14:40    Procedures Procedures   Medications Ordered in ED Medications - No data to display  ED Course  I have reviewed the triage vital signs and the nursing notes.  Pertinent labs & imaging results that were available during my care of the patient were reviewed by me and considered in my medical decision making (see chart for details).    MDM Rules/Calculators/A&P                          80 year old female presents emergency department for lightheadedness and confusion.  Upon arrival, vital signs are stable.  She is not in acute distress.  Exam shows no focal neurologic deficits.  She is currently oriented x3.  Abdomen is soft and nontender.  She has chronic changes to bilateral lower extremities without acute infection or pain.  Does have bilateral erosions and erythema to her gluteal area, more consistent with pressure injury then traumatic injury.  Is reassuring the patient returned to baseline, though will evaluate with labs, head CT, chest x-ray, EKG to further stratify lightheadedness and mental status change.  EKG with normal sinus rhythm.  Poor baseline, but no acute ischemic changes or arrhythmia.  Labs with significant hyponatremia, very mild hyperkalemia.  Mild anemia, no other significant abnormality on CBC.  Urine studies still pending.  CT head with no acute bleed or stroke, no other acute findings.  Portable pelvis x-ray is concerning for inferior and superior right pubic rami fractures along with possible left pubis fracture.  Chest x-ray with opacity concerning for possible mass versus neck chin overlap.  Will obtain CT chest abdomen and pelvis to further evaluate chest x-ray finding in light of hyponatremia.  Additionally, will proceed with pelvic CT to further evaluate pubic fractures.  Son did arrive  and state that he thinks that the blisters/skin changes on her bottom are because she sits on a heating pad all day-this would be consistent with exam.  Does not appear infected  at this time.  CT shows right-sided inferior and superior pubic rami fractures.  Left-sided pubis fracture.  Has 2 left-sided rib fractures, 7 and 8.  Orthopedic surgery was consulted, they came to evaluate patient in the emergency department.  They recommend weightbearing as tolerated, no acute operative management at this time.  Consult placed to hospitalist for admission due to hyponatremia and decreased mobility secondary to fractures.  Patient admitted to hospitalist for further monitoring and treatment.  Final Clinical Impression(s) / ED Diagnoses Final diagnoses:  Difficulty in walking  Hyponatremia    Rx / DC Orders ED Discharge Orders    None       Louretta ParmaStewardson, Jena Tegeler, DO 02/18/21 1523    Tegeler, Canary Brimhristopher J, MD 02/19/21 [phone]

## 2021-02-18 NOTE — ED Notes (Signed)
Pt son requesting to be updated on POC :Heith: [phone]

## 2021-02-18 NOTE — Consult Note (Signed)
Reason for Consult:Pelvic fxs Referring Physician: C Tegeler Time called: 1455 Time at bedside: 1459   Cathy [name] is an 80 y.o. female.  HPI: Cathy [name] was sitting on her couch to walk her dog about 3 weeks ago when the blanket she sat on slid off the couch causing her to fall. She had immediate pain and thought she broke or bruised her tailbone. She did not seek care at that time. As she was able to sit without too much discomfort she figured it was just bruised and she was able to go about her normal activities. Today she was having some dizziness and with her pelvis still hurting her son urged her to come get checked out. CT showed a pelvic ring fx and orthopedic surgery was consulted. She lives in independent living at [facility].  Past Medical History:  Diagnosis Date  . Hypertension     No past surgical history on file.  No family history on file.  Social History:  reports that she has never smoked. She has never used smokeless tobacco. She reports current alcohol use of about 2.0 standard drinks of alcohol per week. She reports that she does not use drugs.  Allergies:  Allergies  Allergen Reactions  . Sulfa Antibiotics Hives  . Sulfamethoxazole-Trimethoprim Rash    Medications: I have reviewed the patient's current medications.  Results for orders placed or performed during the hospital encounter of 02/18/21 (from the past 48 hour(s))  CBG monitoring, ED     Status: Abnormal   Collection Time: 02/18/21 12:03 PM  Result Value Ref Range   Glucose-Capillary 108 (H) 70 - 99 mg/dL    Comment: Glucose reference range applies only to samples taken after fasting for at least 8 hours.  CBC with Differential     Status: Abnormal   Collection Time: 02/18/21 12:17 PM  Result Value Ref Range   WBC 6.0 4.0 - 10.5 K/uL   RBC 3.84 (L) 3.87 - 5.11 MIL/uL   Hemoglobin 11.7 (L) 12.0 - 15.0 g/dL   HCT 44.0 (L) 34.7 - 42.5 %   MCV 90.9 80.0 - 100.0 fL   MCH 30.5 26.0 - 34.0  pg   MCHC 33.5 30.0 - 36.0 g/dL   RDW 95.6 38.7 - 56.4 %   Platelets 294 150 - 400 K/uL   nRBC 0.0 0.0 - 0.2 %   Neutrophils Relative % 62 %   Neutro Abs 3.8 1.7 - 7.7 K/uL   Lymphocytes Relative 20 %   Lymphs Abs 1.2 0.7 - 4.0 K/uL   Monocytes Relative 14 %   Monocytes Absolute 0.8 0.1 - 1.0 K/uL   Eosinophils Relative 2 %   Eosinophils Absolute 0.1 0.0 - 0.5 K/uL   Basophils Relative 1 %   Basophils Absolute 0.1 0.0 - 0.1 K/uL   Immature Granulocytes 1 %   Abs Immature Granulocytes 0.06 0.00 - 0.07 K/uL    Comment: Performed at [facility], [address], [place], [place] [address]  Comprehensive metabolic panel     Status: Abnormal   Collection Time: 02/18/21 12:17 PM  Result Value Ref Range   Sodium 123 (L) 135 - 145 mmol/L   Potassium 5.2 (H) 3.5 - 5.1 mmol/L   Chloride 93 (L) 98 - 111 mmol/L   CO2 24 22 - 32 mmol/L   Glucose, Bld 94 70 - 99 mg/dL    Comment: Glucose reference range applies only to samples taken after fasting for at least 8 hours.  BUN 17 8 - 23 mg/dL   Creatinine, Ser 4.00 0.44 - 1.00 mg/dL   Calcium 8.9 8.9 - 86.7 mg/dL   Total Protein 7.4 6.5 - 8.1 g/dL   Albumin 3.5 3.5 - 5.0 g/dL   AST 32 15 - 41 U/L   ALT 19 0 - 44 U/L   Alkaline Phosphatase 133 (H) 38 - 126 U/L   Total Bilirubin 0.7 0.3 - 1.2 mg/dL   GFR, Estimated >61 >95 mL/min    Comment: (NOTE) Calculated using the CKD-EPI Creatinine Equation (2021)    Anion gap 6 5 - 15    Comment: Performed at [facility], [address], [place], [place] [address]  Troponin I (High Sensitivity)     Status: None   Collection Time: 02/18/21 12:17 PM  Result Value Ref Range   Troponin I (High Sensitivity) 5 <18 ng/L    Comment: (NOTE) Elevated high sensitivity troponin I (hsTnI) values and significant  changes across serial measurements may suggest ACS but many other  chronic and acute conditions are known to elevate hsTnI results.  Refer to the "Links" section for chest pain  algorithms and additional  guidance. Performed at [facility], [address] [place], [place] [address]   Magnesium     Status: None   Collection Time: 02/18/21 12:17 PM  Result Value Ref Range   Magnesium 2.0 1.7 - 2.4 mg/dL    Comment: Performed at [facility], [address] [place], [place] [address]  Resp Panel by RT-PCR (Flu A&B, Covid) Nasopharyngeal Swab     Status: None   Collection Time: 02/18/21 12:27 PM   Specimen: Nasopharyngeal Swab; Nasopharyngeal(NP) swabs in vial transport medium  Result Value Ref Range   SARS Coronavirus 2 by RT PCR NEGATIVE NEGATIVE    Comment: (NOTE) SARS-CoV-2 target nucleic acids are NOT DETECTED.  The SARS-CoV-2 RNA is generally detectable in upper respiratory specimens during the acute phase of infection. The lowest concentration of SARS-CoV-2 viral copies this assay can detect is 138 copies/mL. A negative result does not preclude SARS-Cov-2 infection and should not be used as the sole basis for treatment or other patient management decisions. A negative result may occur with  improper specimen collection/handling, submission of specimen other than nasopharyngeal swab, presence of viral mutation(s) within the areas targeted by this assay, and inadequate number of viral copies(<138 copies/mL). A negative result must be combined with clinical observations, patient history, and epidemiological information. The expected result is Negative.  Fact Sheet for Patients:  BloggerCourse.com  Fact Sheet for Healthcare Providers:  SeriousBroker.it  This test is no t yet approved or cleared by the [place] FDA and  has been authorized for detection and/or diagnosis of SARS-CoV-2 by FDA under an Emergency Use Authorization (EUA). This EUA will remain  in effect (meaning this test can be used) for the duration of the COVID-19 declaration under Section 564(b)(1) of the Act,  21 U.S.C.section 360bbb-3(b)(1), unless the authorization is terminated  or revoked sooner.       Influenza A by PCR NEGATIVE NEGATIVE   Influenza B by PCR NEGATIVE NEGATIVE    Comment: (NOTE) The Xpert Xpress SARS-CoV-2/FLU/RSV plus assay is intended as an aid in the diagnosis of influenza from Nasopharyngeal swab specimens and should not be used as a sole basis for treatment. Nasal washings and aspirates are unacceptable for Xpert Xpress SARS-CoV-2/FLU/RSV testing.  Fact Sheet for Patients: BloggerCourse.com  Fact Sheet for Healthcare Providers: SeriousBroker.it  This test is not yet approved or cleared by the [place] FDA and has been authorized for detection and/or diagnosis of SARS-CoV-2 by FDA under an Emergency Use Authorization (EUA). This EUA will remain in effect (meaning this test can be used) for the duration of the COVID-19 declaration under Section 564(b)(1) of the Act, 21 U.S.C. section 360bbb-3(b)(1), unless the authorization is terminated or revoked.  Performed at [facility], [address] [place], [place] [id number]     CT Head Wo Contrast  Result Date: 02/18/2021 CLINICAL DATA:  Mental status change EXAM: CT HEAD WITHOUT CONTRAST TECHNIQUE: Contiguous axial images were obtained from the base of the skull through the vertex without intravenous contrast. COMPARISON:  None. FINDINGS: Brain: Moderate cerebral atrophy. Ventricular enlargement consistent with the level of atrophy. Moderate white matter hypodensity bilaterally is symmetric. Negative for acute infarct, acute hemorrhage, mass Vascular: Negative for hyperdense vessel Skull: Negative Sinuses/Orbits: The paranasal sinuses are clear.  Negative orbit Other: None IMPRESSION: No acute abnormality Moderate atrophy and moderate chronic microvascular ischemic changes in the white matter Electronically Signed   By: Marlan Palauharles  Clark M.D.   On: 02/18/2021  13:34   DG Pelvis Portable  Result Date: 02/18/2021 CLINICAL DATA:  Fall 2 weeks ago EXAM: PORTABLE PELVIS 1-2 VIEWS COMPARISON:  None. FINDINGS: Moderately displaced fracture of the right superior pubic ramus and parasymphyseal aspect of the right pubic bone. Nondisplaced fracture of the right inferior pubic ramus. Possible nondisplaced fracture involving the parasymphyseal aspect of the left pubic bone. Pubic symphysis intact without diastasis. SI joints appear intact without diastasis. No hip fracture or dislocation. IMPRESSION: 1. Moderately displaced fracture of the right superior pubic ramus and parasymphyseal aspect of the right pubic bone. 2. Nondisplaced fracture of the right inferior pubic ramus. 3. Possible nondisplaced fracture involving the parasymphyseal aspect of the left pubic bone. Electronically Signed   By: Duanne GuessNicholas  Plundo D.O.   On: 02/18/2021 12:55   DG Chest Portable 1 View  Result Date: 02/18/2021 CLINICAL DATA:  Lightheadedness. EXAM: PORTABLE CHEST 1 VIEW COMPARISON:  None. FINDINGS: The heart size and mediastinal contours are within normal limits. Masslike opacity at the right lung apex may be secondary to overlap of the patient's head/neck in this region. Otherwise, no consolidation. No visible pleural effusions or pneumothorax on the single AP semi erect radiograph. No acute osseous abnormality. IMPRESSION: 1. Masslike opacity at the right lung apex may be secondary to overlap of the patient's head/neck in this region; however, recommend dedicated PA and lateral radiographs with extension of the neck to better evaluate this area and exclude a mass. 2. Otherwise, no acute cardiopulmonary disease. Electronically Signed   By: Feliberto HartsFrederick S Jones MD   On: 02/18/2021 13:00   CT CHEST ABDOMEN PELVIS WO CONTRAST  Result Date: 02/18/2021 CLINICAL DATA:  Fall.  Dizziness. EXAM: CT CHEST, ABDOMEN AND PELVIS WITHOUT CONTRAST TECHNIQUE: Multidetector CT imaging of the chest, abdomen and  pelvis was performed following the standard protocol without IV contrast. COMPARISON:  Radiograph of same day. FINDINGS: CT CHEST FINDINGS Cardiovascular: Atherosclerosis of thoracic aorta is noted without aneurysm formation. Normal cardiac size. No pericardial effusion. Mild coronary artery calcifications are noted. Mediastinum/Nodes: Small sliding-type hiatal hernia is noted. No adenopathy is noted. Thyroid gland is unremarkable. Lungs/Pleura: Lungs are clear. No pleural effusion or pneumothorax. Musculoskeletal: Minimally displaced fractures are seen involving the lateral portion of the left seventh and eighth ribs. CT ABDOMEN PELVIS FINDINGS Hepatobiliary: No focal liver abnormality is seen. No  gallstones, gallbladder wall thickening, or biliary dilatation. Pancreas: Unremarkable. No pancreatic ductal dilatation or surrounding inflammatory changes. Spleen: Normal in size without focal abnormality. Adrenals/Urinary Tract: Adrenal glands are unremarkable. Kidneys are normal, without renal calculi, focal lesion, or hydronephrosis. Bladder is unremarkable. Stomach/Bowel: Stomach is within normal limits. Appendix appears normal. No evidence of bowel wall thickening, distention, or inflammatory changes. Sigmoid diverticulosis is noted without inflammation. Vascular/Lymphatic: Aortic atherosclerosis. No enlarged abdominal or pelvic lymph nodes. Reproductive: Uterus and bilateral adnexa are unremarkable. Other: No abdominal wall hernia or abnormality. No abdominopelvic ascites. Musculoskeletal: Mildly displaced fractures are seen involving the right superior and inferior pubic rami. Nondisplaced left parasymphyseal pubic fracture is noted. IMPRESSION: Minimally displaced fractures are seen involving the left seventh and eighth ribs. Mildly displaced fractures are seen involving the right superior and inferior pubic rami. Nondisplaced left parasymphyseal pubic fracture is noted. Small sliding-type hiatal hernia. Mild  coronary artery calcifications are noted. Sigmoid diverticulosis is noted without inflammation. Aortic Atherosclerosis (ICD10-I70.0). Electronically Signed   By: Lupita Raider M.D.   On: 02/18/2021 14:40    Review of Systems  HENT: Negative for ear discharge, ear pain, hearing loss and tinnitus.   Eyes: Negative for photophobia and pain.  Respiratory: Negative for cough and shortness of breath.   Cardiovascular: Negative for chest pain.  Gastrointestinal: Negative for abdominal pain, nausea and vomiting.  Genitourinary: Negative for dysuria, flank pain, frequency and urgency.  Musculoskeletal: Positive for arthralgias (Pelvis). Negative for back pain, myalgias and neck pain.  Neurological: Positive for dizziness. Negative for headaches.  Hematological: Does not bruise/bleed easily.  Psychiatric/Behavioral: The patient is not nervous/anxious.    Blood pressure 125/79, pulse 72, temperature (!) 97.5 F (36.4 C), temperature source Oral, resp. rate (!) 22, height 5' (1.524 m), weight 54.4 kg, SpO2 100 %. Physical Exam Constitutional:      General: She is not in acute distress.    Appearance: She is well-developed. She is not diaphoretic.  HENT:     Head: Normocephalic and atraumatic.  Eyes:     General: No scleral icterus.       Right eye: No discharge.        Left eye: No discharge.     Conjunctiva/sclera: Conjunctivae normal.  Cardiovascular:     Rate and Rhythm: Normal rate and regular rhythm.  Pulmonary:     Effort: Pulmonary effort is normal. No respiratory distress.  Musculoskeletal:     Cervical back: Normal range of motion.     Comments: Pelvis--no traumatic wounds or rash, no ecchymosis, stable to manual stress, mild TTP   LLE No traumatic wounds, ecchymosis, or rash  Nontender  No knee or ankle effusion  Knee stable to varus/ valgus and anterior/posterior stress  Sens DPN, SPN, TN intact  Motor EHL, ext, flex, evers 5/5  DP 1+, PT 0, No significant edema  Skin:     General: Skin is warm and dry.  Neurological:     Mental Status: She is alert.  Psychiatric:        Behavior: Behavior normal.     Assessment/Plan: Pubic rami fxs -- May be WBAT BLE. F/u with Dr. Aundria Rud in 3 weeks.    Freeman Caldron, PA-C Orthopedic Surgery [phone] 02/18/2021, 3:06 PM

## 2021-02-18 NOTE — ED Triage Notes (Addendum)
Pt to ED via EMS from [facility] c/o dizziness that started this morning. A&O X3; Apparently pt is also slower  to respond than normal. Pt also apparently fell out of a chair and landed on her tail bone a 2 weeks ago, did not seek treatment, concern for possible wound. Has had pain in area. Uses walker to assist with ambulation. Bilat Lower extremity edema noted, pt reports feet are more swollen than normal. Last VS:145/59, p 72, 99%ra, cbg 118, 97.3. RR 18. Medical hx: CHF, HTN.  #18 lac. No medications given by EMS

## 2021-02-18 NOTE — Progress Notes (Signed)
Pt brought to room 2W32. Pt peri area cleansed x2 large stage 2 wounds to bottom. See LDAs. Foam dressing applied, wound care consulted. Pt settled in bed and report given to Rayfield Citizen, Charity fundraiser on coming nurse.

## 2021-02-18 NOTE — ED Notes (Signed)
Attempted report at this time.

## 2021-02-19 ENCOUNTER — Inpatient Hospital Stay (HOSPITAL_COMMUNITY): Payer: Medicare Other

## 2021-02-19 DIAGNOSIS — R609 Edema, unspecified: Secondary | ICD-10-CM | POA: Diagnosis not present

## 2021-02-19 DIAGNOSIS — R262 Difficulty in walking, not elsewhere classified: Secondary | ICD-10-CM

## 2021-02-19 DIAGNOSIS — E871 Hypo-osmolality and hyponatremia: Principal | ICD-10-CM

## 2021-02-19 DIAGNOSIS — E875 Hyperkalemia: Secondary | ICD-10-CM | POA: Diagnosis not present

## 2021-02-19 DIAGNOSIS — I890 Lymphedema, not elsewhere classified: Secondary | ICD-10-CM | POA: Diagnosis not present

## 2021-02-19 DIAGNOSIS — S32591D Other specified fracture of right pubis, subsequent encounter for fracture with routine healing: Secondary | ICD-10-CM

## 2021-02-19 LAB — BASIC METABOLIC PANEL
Anion gap: 9 (ref 5–15)
Anion gap: 6 (ref 5–15)
Anion gap: 5 (ref 5–15)
BUN: 13 mg/dL (ref 8–23)
BUN: 13 mg/dL (ref 8–23)
BUN: 11 mg/dL (ref 8–23)
CO2: 24 mmol/L (ref 22–32)
CO2: 21 mmol/L — ABNORMAL LOW (ref 22–32)
CO2: 23 mmol/L (ref 22–32)
Calcium: 8.7 mg/dL — ABNORMAL LOW (ref 8.9–10.3)
Calcium: 8.7 mg/dL — ABNORMAL LOW (ref 8.9–10.3)
Calcium: 8.8 mg/dL — ABNORMAL LOW (ref 8.9–10.3)
Chloride: 95 mmol/L — ABNORMAL LOW (ref 98–111)
Chloride: 102 mmol/L (ref 98–111)
Chloride: 101 mmol/L (ref 98–111)
Creatinine, Ser: 0.84 mg/dL (ref 0.44–1.00)
Creatinine, Ser: 0.75 mg/dL (ref 0.44–1.00)
Creatinine, Ser: 0.84 mg/dL (ref 0.44–1.00)
GFR, Estimated: >60 mL/min (ref >60)
GFR, Estimated: >60 mL/min (ref >60)
GFR, Estimated: >60 mL/min (ref >60)
Glucose, Bld: 94 mg/dL (ref 70–99)
Glucose, Bld: 89 mg/dL (ref 70–99)
Glucose, Bld: 217 mg/dL — ABNORMAL HIGH (ref 70–99)
Potassium: 4.7 mmol/L (ref 3.5–5.1)
Potassium: 4.5 mmol/L (ref 3.5–5.1)
Potassium: 4.7 mmol/L (ref 3.5–5.1)
Sodium: 128 mmol/L — ABNORMAL LOW (ref 135–145)
Sodium: 129 mmol/L — ABNORMAL LOW (ref 135–145)
Sodium: 129 mmol/L — ABNORMAL LOW (ref 135–145)

## 2021-02-19 LAB — CBC
HCT: 34.2 % — ABNORMAL LOW (ref 36.0–46.0)
Hemoglobin: 11.6 g/dL — ABNORMAL LOW (ref 12.0–15.0)
MCH: 30.6 pg (ref 26.0–34.0)
MCHC: 33.9 g/dL (ref 30.0–36.0)
MCV: 90.2 fL (ref 80.0–100.0)
Platelets: 275 10*3/uL (ref 150–400)
RBC: 3.79 MIL/uL — ABNORMAL LOW (ref 3.87–5.11)
RDW: 12.3 % (ref 11.5–15.5)
WBC: 6.6 10*3/uL (ref 4.0–10.5)
nRBC: 0 % (ref 0.0–0.2)

## 2021-02-19 MED ORDER — ALBUTEROL SULFATE (2.5 MG/3ML) 0.083% IN NEBU: 2.5000 mg | INHALATION_SOLUTION | RESPIRATORY_TRACT | Status: DC | PRN

## 2021-02-19 NOTE — Evaluation (Signed)
Physical Therapy Evaluation Patient Details Name: Cathy [name] MRN: [id number] DOB: 1941/03/28 Today's Date: 02/19/2021   History of Present Illness  pt is a 80 y/o female presenting 5/27 with dizziness.  She recently slipped off the bed2-3 wks ago onto her "tailbone", now with c/o pain and difficulty walking.  Imaging showing L seventh and eighth rib fx's, right sup/in pubic rami fx's and a nondisplaced L symphyseal pubic fracture.  PMHx:  HTN  Clinical Impression  Pt is not at baseline functioning, but should be safe at home with available assist from facility. There are no further acute PT needs.  Will sign off at this time.     Follow Up Recommendations No PT follow up    Equipment Recommendations  None recommended by PT    Recommendations for Other Services       Precautions / Restrictions Precautions Precautions: Fall      Mobility  Bed Mobility Overal bed mobility: Needs Assistance Bed Mobility: Rolling;Sidelying to Sit Rolling: Min guard Sidelying to sit: Min assist       General bed mobility comments: min if can't use rails or raise HOB    Transfers Overall transfer level: Needs assistance Equipment used: 4-wheeled walker Transfers: Sit to/from Stand Sit to Stand: Supervision         General transfer comment: cues for safer hand placement and use of the brakes  Ambulation/Gait Ambulation/Gait assistance: Supervision Gait Distance (Feet): 160 Feet Assistive device: 4-wheeled walker Gait Pattern/deviations: Step-through pattern   Gait velocity interpretation: <1.8 ft/sec, indicate of risk for recurrent falls General Gait Details: steady overall with safe use of the rollator, slow cadence.  Stairs            Wheelchair Mobility    Modified Rankin (Stroke Patients Only)       Balance Overall balance assessment: Needs assistance Sitting-balance support: No upper extremity supported Sitting balance-Leahy Scale: Fair     Standing  balance support: No upper extremity supported;Bilateral upper extremity supported Standing balance-Leahy Scale: Fair                               Pertinent Vitals/Pain Pain Assessment: Faces Faces Pain Scale: Hurts little more Pain Location: "tailbone" the worst. Pain Descriptors / Indicators: Sore Pain Intervention(s): Monitored during session    Home Living Family/patient expects to be discharged to:: Other (Comment) (independent living apt.) Living Arrangements: Alone Available Help at Discharge: Other (Comment) (available attendents) Type of Home: Apartment Home Access: Level entry     Home Layout: One level Home Equipment: Walker - 4 wheels      Prior Function Level of Independence: Independent with assistive device(s)         Comments: can complete her own ADL's, walks with a rollator in apt and to the dining room before the fall.     Hand Dominance        Extremity/Trunk Assessment   Upper Extremity Assessment Upper Extremity Assessment: Overall WFL for tasks assessed    Lower Extremity Assessment Lower Extremity Assessment: Overall WFL for tasks assessed (NT formally, but by function)       Communication   Communication: No difficulties  Cognition Arousal/Alertness: Awake/alert Behavior During Therapy: WFL for tasks assessed/performed Overall Cognitive Status: Within Functional Limits for tasks assessed  General Comments General comments (skin integrity, edema, etc.): vss    Exercises     Assessment/Plan    PT Assessment Patent does not need any further PT services  PT Problem List Decreased activity tolerance;Decreased strength;Decreased balance;Decreased mobility;Pain       PT Treatment Interventions      PT Goals (Current goals can be found in the Care Plan section)  Acute Rehab PT Goals Patient Stated Goal: I hope to go home today PT Goal Formulation: All assessment  and education complete, DC therapy    Frequency     Barriers to discharge        Co-evaluation               AM-PAC PT "6 Clicks" Mobility  Outcome Measure Help needed turning from your back to your side while in a flat bed without using bedrails?: A Little Help needed moving from lying on your back to sitting on the side of a flat bed without using bedrails?: A Little Help needed moving to and from a bed to a chair (including a wheelchair)?: A Little Help needed standing up from a chair using your arms (e.g., wheelchair or bedside chair)?: A Little Help needed to walk in hospital room?: A Little Help needed climbing 3-5 steps with a railing? : A Lot 6 Click Score: 17    End of Session   Activity Tolerance: Patient tolerated treatment well Patient left: in chair;with call bell/phone within reach Nurse Communication: Mobility status PT Visit Diagnosis: Other abnormalities of gait and mobility (R26.89);Pain;Difficulty in walking, not elsewhere classified (R26.2) Pain - part of body:  (sacral, pelvis)    Time: 7026-3785 PT Time Calculation (min) (ACUTE ONLY): 27 min   Charges:   PT Evaluation $PT Eval Moderate Complexity: 1 Mod PT Treatments $Gait Training: 8-22 mins        02/19/2021  Jacinto Halim., PT Acute Rehabilitation Services [phone]  (pager) [phone]  (office)  Eliseo Gum Aloha Bartok 02/19/2021, 4:43 PM

## 2021-02-19 NOTE — Progress Notes (Signed)
[facility]  PROGRESS NOTE  Cathy [name] ZOX:[id number] DOB: 05/05/1941 DOA: 02/18/2021 PCP: Darrin Nipperollege, [facility] [place]   Brief HPI:   80 year old female with medical history of hypertension, lymphedema presented with dizziness.  Symptoms started after she had breakfast.  Patient had a fall 3 weeks ago and landed on her tailbone.  Denies loss of consciousness.  In the ED she was found to have pubic ramus fracture and rib fractures.  Orthopedics was consulted and they recommended weightbearing as tolerated and follow-up in the office of Dr. Aundria Rudogers in 3 weeks.  Patient also found to be hyponatremic and started on IV normal saline.    Subjective   Patient seen and examined, wants to go home.   Assessment/Plan:     1. Pubic ramus fractures-secondary to fall.  Patient fell 2 weeks ago.  X-ray shows displaced fractures with superior pubic rami, displaced left symphyseal pubic fracture.  Orthopedics was consulted and recommended weightbearing as tolerated.  Follow-up after results in clinic.  PT/OT has been consulted, will follow recommendations. 2. Hyponatremia-patient presented with sodium 123, likely hypovolemic hyponatremia.  Patient is taking Lasix every other day at home.  Currently Lasix on hold.  Patient started on IV magnesium.  Sodium is up to 129.  We will hold fluids at this time.  See diabetes mellitus pending.  Follow labs in a.m. 3. Hyperkalemia-potassium i was elevated 5.2, patient with potassium limitation daily at home.  This morning potassium is 4.7. 4. Hypertension-blood pressure is stable.  Continue metoprolol, lisinopril. 5. Chronic lymphedema-stable.  Venous duplex of lower extremities were negative for DVT.   Scheduled medications:   . enoxaparin (LOVENOX) injection  40 mg Subcutaneous Q24H  . lisinopril  20 mg Oral Daily  . metoprolol succinate  25 mg Oral Daily         Data Reviewed:   CBG:  Recent Labs  Lab 02/18/21 1203  GLUCAP  108*    SpO2: 97 %    Vitals:   02/19/21 0504 02/19/21 0710 02/19/21 0900 02/19/21 1232  BP: (!) 147/62  (!) 162/81 (!) 152/61  Pulse: 77  89 72  Resp: 18   17  Temp: 98.5 F (36.9 C)   98 F (36.7 C)  TempSrc: Oral     SpO2: 98% 98%  97%  Weight:      Height:         Intake/Output Summary (Last 24 hours) at 02/19/2021 1603 Last data filed at 02/19/2021 1527 Gross per 24 hour  Intake 846.26 ml  Output 2250 ml  Net -1403.74 ml    05/26 1901 - 05/28 0700 In: 846.3 [I.V.:846.3] Out: 800 [Urine:800]  Filed Weights   02/18/21 1151  Weight: 54.4 kg    CBC:  Recent Labs  Lab 02/18/21 1217 02/19/21 0120  WBC 6.0 6.6  HGB 11.7* 11.6*  HCT 34.9* 34.2*  PLT 294 275  MCV 90.9 90.2  MCH 30.5 30.6  MCHC 33.5 33.9  RDW 12.3 12.3  LYMPHSABS 1.2  --   MONOABS 0.8  --   EOSABS 0.1  --   BASOSABS 0.1  --     Complete metabolic panel:  Recent Labs  Lab 02/18/21 1217 02/18/21 1928 02/19/21 0120 02/19/21 0806 02/19/21 1436  NA 123* 125* 128* 129* 129*  K 5.2* 5.0 4.7 4.5 4.7  CL 93* 94* 95* 102 101  CO2 24 25 24  21* 23  GLUCOSE 94 126* 94 89 217*  BUN 17 14 13  13  11  CREATININE 0.94 0.87 0.84 0.75 0.84  CALCIUM 8.9 8.7* 8.7* 8.7* 8.8*  AST 32  --   --   --   --   ALT 19  --   --   --   --   ALKPHOS 133*  --   --   --   --   BILITOT 0.7  --   --   --   --   ALBUMIN 3.5  --   --   --   --   MG 2.0  --   --   --   --   CRP  --  0.5  --   --   --   DDIMER  --  2.33*  --   --   --     No results for input(s): LIPASE, AMYLASE in the last 168 hours.  Recent Labs  Lab 02/18/21 1227 02/18/21 1928  CRP  --  0.5  DDIMER  --  2.33*  SARSCOV2NAA NEGATIVE  --     ------------------------------------------------------------------------------------------------------------------ No results for input(s): CHOL, HDL, LDLCALC, TRIG, CHOLHDL, LDLDIRECT in the last 72 hours.  No results found for:  HGBA1C ------------------------------------------------------------------------------------------------------------------ No results for input(s): TSH, T4TOTAL, T3FREE, THYROIDAB in the last 72 hours.  Invalid input(s): FREET3 ------------------------------------------------------------------------------------------------------------------ No results for input(s): VITAMINB12, FOLATE, FERRITIN, TIBC, IRON, RETICCTPCT in the last 72 hours.  Coagulation profile No results for input(s): INR, PROTIME in the last 168 hours. Recent Labs    02/18/21 1928  DDIMER 2.33*    Cardiac Enzymes No results for input(s): CKTOTAL, CKMB, CKMBINDEX, TROPONINI in the last 168 hours.  ------------------------------------------------------------------------------------------------------------------ No results found for: BNP   Antibiotics: Anti-infectives (From admission, onward)   None       Radiology Reports  CT Head Wo Contrast  Result Date: 02/18/2021 CLINICAL DATA:  Mental status change EXAM: CT HEAD WITHOUT CONTRAST TECHNIQUE: Contiguous axial images were obtained from the base of the skull through the vertex without intravenous contrast. COMPARISON:  None. FINDINGS: Brain: Moderate cerebral atrophy. Ventricular enlargement consistent with the level of atrophy. Moderate white matter hypodensity bilaterally is symmetric. Negative for acute infarct, acute hemorrhage, mass Vascular: Negative for hyperdense vessel Skull: Negative Sinuses/Orbits: The paranasal sinuses are clear.  Negative orbit Other: None IMPRESSION: No acute abnormality Moderate atrophy and moderate chronic microvascular ischemic changes in the white matter Electronically Signed   By: Marlan Palau M.D.   On: 02/18/2021 13:34   DG Pelvis Portable  Result Date: 02/18/2021 CLINICAL DATA:  Fall 2 weeks ago EXAM: PORTABLE PELVIS 1-2 VIEWS COMPARISON:  None. FINDINGS: Moderately displaced fracture of the right superior pubic ramus and  parasymphyseal aspect of the right pubic bone. Nondisplaced fracture of the right inferior pubic ramus. Possible nondisplaced fracture involving the parasymphyseal aspect of the left pubic bone. Pubic symphysis intact without diastasis. SI joints appear intact without diastasis. No hip fracture or dislocation. IMPRESSION: 1. Moderately displaced fracture of the right superior pubic ramus and parasymphyseal aspect of the right pubic bone. 2. Nondisplaced fracture of the right inferior pubic ramus. 3. Possible nondisplaced fracture involving the parasymphyseal aspect of the left pubic bone. Electronically Signed   By: Duanne Guess D.O.   On: 02/18/2021 12:55   DG Chest Portable 1 View  Result Date: 02/18/2021 CLINICAL DATA:  Lightheadedness. EXAM: PORTABLE CHEST 1 VIEW COMPARISON:  None. FINDINGS: The heart size and mediastinal contours are within normal limits. Masslike opacity at the right lung apex may be secondary to overlap of  the patient's head/neck in this region. Otherwise, no consolidation. No visible pleural effusions or pneumothorax on the single AP semi erect radiograph. No acute osseous abnormality. IMPRESSION: 1. Masslike opacity at the right lung apex may be secondary to overlap of the patient's head/neck in this region; however, recommend dedicated PA and lateral radiographs with extension of the neck to better evaluate this area and exclude a mass. 2. Otherwise, no acute cardiopulmonary disease. Electronically Signed   By: Feliberto Harts MD   On: 02/18/2021 13:00   VAS [place] LOWER EXTREMITY VENOUS (DVT)  Result Date: 02/19/2021  Lower Venous DVT Study Patient Name:  KATRENIA ALKINS  Date of Exam:   02/19/2021 Medical Rec #: [id number]          Accession #:    [id number] Date of Birth: [birth year]          Patient Gender: F Patient Age:   79Y Exam Location:  [facility] Procedure:      VAS [place] LOWER EXTREMITY VENOUS (DVT) Referring Phys: Gomez Cleverly Doral Digangi  --------------------------------------------------------------------------------  Indications: Edema.  Risk Factors: Patient has history of chronic lymphedema, keeps legs wrapped at home. Comparison Study: No prior study on file Performing Technologist: Sherren Kerns RVS  Examination Guidelines: A complete evaluation includes B-mode imaging, spectral Doppler, color Doppler, and power Doppler as needed of all accessible portions of each vessel. Bilateral testing is considered an integral part of a complete examination. Limited examinations for reoccurring indications may be performed as noted. The reflux portion of the exam is performed with the patient in reverse Trendelenburg.  +---------+---------------+---------+-----------+----------+------------------+ RIGHT    CompressibilityPhasicitySpontaneityPropertiesThrombus Aging     +---------+---------------+---------+-----------+----------+------------------+ CFV      Full                                         pulsatile waveform +---------+---------------+---------+-----------+----------+------------------+ SFJ      Full                                                            +---------+---------------+---------+-----------+----------+------------------+ FV Prox  Full                                                            +---------+---------------+---------+-----------+----------+------------------+ FV Mid   Full                                                            +---------+---------------+---------+-----------+----------+------------------+ FV DistalFull                                                            +---------+---------------+---------+-----------+----------+------------------+ PFV  Full                                                            +---------+---------------+---------+-----------+----------+------------------+ POP      Full           Yes      Yes                                      +---------+---------------+---------+-----------+----------+------------------+ PTV      Full                                                            +---------+---------------+---------+-----------+----------+------------------+ PERO     Full                                                            +---------+---------------+---------+-----------+----------+------------------+   +---------+---------------+---------+-----------+----------+------------------+ LEFT     CompressibilityPhasicitySpontaneityPropertiesThrombus Aging     +---------+---------------+---------+-----------+----------+------------------+ CFV      Full                                         pulsatile waveform +---------+---------------+---------+-----------+----------+------------------+ SFJ      Full                                                            +---------+---------------+---------+-----------+----------+------------------+ FV Prox  Full                                                            +---------+---------------+---------+-----------+----------+------------------+ FV Mid   Full                                                            +---------+---------------+---------+-----------+----------+------------------+ FV DistalFull                                                            +---------+---------------+---------+-----------+----------+------------------+ PFV      Full  pulsatile waveform +---------+---------------+---------+-----------+----------+------------------+ POP      Full           Yes      Yes                                     +---------+---------------+---------+-----------+----------+------------------+     Summary: RIGHT: - There is no evidence of deep vein thrombosis in the lower extremity.  - A cystic structure is found in the popliteal fossa.  LEFT: - There is no  evidence of deep vein thrombosis in the lower extremity.  - A cystic structure is found in the popliteal fossa.  *See table(s) above for measurements and observations. Electronically signed by Sherald Hess MD on 02/19/2021 at 3:30:21 PM.    Final    CT CHEST ABDOMEN PELVIS WO CONTRAST  Result Date: 02/18/2021 CLINICAL DATA:  Fall.  Dizziness. EXAM: CT CHEST, ABDOMEN AND PELVIS WITHOUT CONTRAST TECHNIQUE: Multidetector CT imaging of the chest, abdomen and pelvis was performed following the standard protocol without IV contrast. COMPARISON:  Radiograph of same day. FINDINGS: CT CHEST FINDINGS Cardiovascular: Atherosclerosis of thoracic aorta is noted without aneurysm formation. Normal cardiac size. No pericardial effusion. Mild coronary artery calcifications are noted. Mediastinum/Nodes: Small sliding-type hiatal hernia is noted. No adenopathy is noted. Thyroid gland is unremarkable. Lungs/Pleura: Lungs are clear. No pleural effusion or pneumothorax. Musculoskeletal: Minimally displaced fractures are seen involving the lateral portion of the left seventh and eighth ribs. CT ABDOMEN PELVIS FINDINGS Hepatobiliary: No focal liver abnormality is seen. No gallstones, gallbladder wall thickening, or biliary dilatation. Pancreas: Unremarkable. No pancreatic ductal dilatation or surrounding inflammatory changes. Spleen: Normal in size without focal abnormality. Adrenals/Urinary Tract: Adrenal glands are unremarkable. Kidneys are normal, without renal calculi, focal lesion, or hydronephrosis. Bladder is unremarkable. Stomach/Bowel: Stomach is within normal limits. Appendix appears normal. No evidence of bowel wall thickening, distention, or inflammatory changes. Sigmoid diverticulosis is noted without inflammation. Vascular/Lymphatic: Aortic atherosclerosis. No enlarged abdominal or pelvic lymph nodes. Reproductive: Uterus and bilateral adnexa are unremarkable. Other: No abdominal wall hernia or abnormality. No  abdominopelvic ascites. Musculoskeletal: Mildly displaced fractures are seen involving the right superior and inferior pubic rami. Nondisplaced left parasymphyseal pubic fracture is noted. IMPRESSION: Minimally displaced fractures are seen involving the left seventh and eighth ribs. Mildly displaced fractures are seen involving the right superior and inferior pubic rami. Nondisplaced left parasymphyseal pubic fracture is noted. Small sliding-type hiatal hernia. Mild coronary artery calcifications are noted. Sigmoid diverticulosis is noted without inflammation. Aortic Atherosclerosis (ICD10-I70.0). Electronically Signed   By: Lupita Raider M.D.   On: 02/18/2021 14:40      DVT prophylaxis: Lovenox  Code Status: Full code  Family Communication: No family at bedside   Consultants:    Procedures:      Objective    Physical Examination:   General-appears in no acute distress Heart-S1-S2, regular, no murmur auscultated Lungs-clear to auscultation bilaterally, no wheezing or crackles auscultated Abdomen-soft, nontender, no organomegaly Extremities-no edema in the lower extremities Neuro-alert, oriented x3, no focal deficit noted  Status is: Inpatient  Dispo: The patient is from: Home              Anticipated d/c is to: Home              Anticipated d/c date is: 02/20/2021              Patient currently not stable for  discharge  Barrier to discharge-pending PT evaluation  COVID-19 Labs  Recent Labs    02/18/21 1928  DDIMER 2.33*  CRP 0.5    Lab Results  Component Value Date   SARSCOV2NAA NEGATIVE 02/18/2021    Microbiology  Recent Results (from the past 240 hour(s))  Resp Panel by RT-PCR (Flu A&B, Covid) Nasopharyngeal Swab     Status: None   Collection Time: 02/18/21 12:27 PM   Specimen: Nasopharyngeal Swab; Nasopharyngeal(NP) swabs in vial transport medium  Result Value Ref Range Status   SARS Coronavirus 2 by RT PCR NEGATIVE NEGATIVE Final    Comment:  (NOTE) SARS-CoV-2 target nucleic acids are NOT DETECTED.  The SARS-CoV-2 RNA is generally detectable in upper respiratory specimens during the acute phase of infection. The lowest concentration of SARS-CoV-2 viral copies this assay can detect is 138 copies/mL. A negative result does not preclude SARS-Cov-2 infection and should not be used as the sole basis for treatment or other patient management decisions. A negative result may occur with  improper specimen collection/handling, submission of specimen other than nasopharyngeal swab, presence of viral mutation(s) within the areas targeted by this assay, and inadequate number of viral copies(<138 copies/mL). A negative result must be combined with clinical observations, patient history, and epidemiological information. The expected result is Negative.  Fact Sheet for Patients:  BloggerCourse.com  Fact Sheet for Healthcare Providers:  SeriousBroker.it  This test is no t yet approved or cleared by the [place] FDA and  has been authorized for detection and/or diagnosis of SARS-CoV-2 by FDA under an Emergency Use Authorization (EUA). This EUA will remain  in effect (meaning this test can be used) for the duration of the COVID-19 declaration under Section 564(b)(1) of the Act, 21 U.S.C.section 360bbb-3(b)(1), unless the authorization is terminated  or revoked sooner.       Influenza A by PCR NEGATIVE NEGATIVE Final   Influenza B by PCR NEGATIVE NEGATIVE Final    Comment: (NOTE) The Xpert Xpress SARS-CoV-2/FLU/RSV plus assay is intended as an aid in the diagnosis of influenza from Nasopharyngeal swab specimens and should not be used as a sole basis for treatment. Nasal washings and aspirates are unacceptable for Xpert Xpress SARS-CoV-2/FLU/RSV testing.  Fact Sheet for Patients: BloggerCourse.com  Fact Sheet for Healthcare  Providers: SeriousBroker.it  This test is not yet approved or cleared by the [place] FDA and has been authorized for detection and/or diagnosis of SARS-CoV-2 by FDA under an Emergency Use Authorization (EUA). This EUA will remain in effect (meaning this test can be used) for the duration of the COVID-19 declaration under Section 564(b)(1) of the Act, 21 U.S.C. section 360bbb-3(b)(1), unless the authorization is terminated or revoked.  Performed at [facility], [address], [place], [place] [address]     Pressure Injury 02/18/21 Buttocks Right Stage 2 -  Partial thickness loss of dermis presenting as a shallow open injury with a red, pink wound bed without slough. (Active)  02/18/21 1857  Location: Buttocks  Location Orientation: Right  Staging: Stage 2 -  Partial thickness loss of dermis presenting as a shallow open injury with a red, pink wound bed without slough.  Wound Description (Comments):   Present on Admission: Yes     Pressure Injury 02/18/21 Buttocks Left Stage 2 -  Partial thickness loss of dermis presenting as a shallow open injury with a red, pink wound bed without slough. (Active)  02/18/21 1858  Location: Buttocks  Location Orientation: Left  Staging: Stage 2 -  Partial thickness loss of dermis presenting as a shallow open injury with a red, pink wound bed without slough.  Wound Description (Comments):   Present on Admission: Yes          Maryagnes Carrasco S Trinity Hyland   [facility] If 7PM-7AM, please contact night-coverage at [email or web address], Office  [phone]   02/19/2021, 4:03 PM  LOS: 1 day

## 2021-02-19 NOTE — TOC Initial Note (Signed)
Transition of Care [facility]) - Initial/Assessment Note    Patient Details  Name: Cathy [name] MRN: [id number] Date of Birth: [birth year]  [facility] CM/SW Contact:    Carles Collet, RN Phone Number: 02/19/2021, 4:15 PM  Clinical Narrative:      Met with patient at bedside. She states that she is from [place].  She states that she was going to start getting therapy from [place], their onsite provider. Please fax home health PT OT orders, with "Outpatient services"  Typed in comment section to [facility]       Ph [phone]   Expected Discharge Plan:  Gerda Diss) Barriers to Discharge: Continued Medical Work up   Patient Goals and CMS Choice        Expected Discharge Plan and Services Expected Discharge Plan:  [facility] Diss) In-house Referral: Clinical Social Work Discharge Planning Services: CM Consult                                          Prior Living Arrangements/Services   Lives with:: Self                   Activities of Daily Living      Permission Sought/Granted                  Emotional Assessment              Admission diagnosis:  Hyponatremia [E87.1] Difficulty in walking [R26.2] Patient Active Problem List   Diagnosis Date Noted  . Hyponatremia 02/18/2021  . Pubic ramus fracture ([place]) 02/18/2021  . Left rib fracture 02/18/2021  . Hyperkalemia 02/18/2021  . Sacral pressure ulcer 02/18/2021  . Lymphedema 02/18/2021   PCP:  [facility] [place] [facility] [place]:   [facility] [address], [place] [address] [place] [address] Phone: [phone] Fax: [phone]     Social Determinants of Health (SDOH) Interventions    Readmission Risk Interventions No flowsheet data found.

## 2021-02-19 NOTE — Progress Notes (Signed)
VASCULAR LAB    Bilateral lower extremity venous duplex has been performed.  See CV proc for preliminary results.   Deadra Diggins, RVT 02/19/2021, 12:55 PM

## 2021-02-19 NOTE — Progress Notes (Signed)
OT Cancellation Note  Patient Details Name: Cathy [name] MRN: [id number] DOB: 1941-08-15   Cancelled Treatment:    Reason Eval/Treat Not Completed: Patient at procedure or test/ unavailable Doppler being performed at this time. Will follow-up for OT eval once study results received.  Lorre Munroe 02/19/2021, 10:32 AM

## 2021-02-19 NOTE — Plan of Care (Signed)

## 2021-02-20 DIAGNOSIS — E871 Hypo-osmolality and hyponatremia: Secondary | ICD-10-CM | POA: Diagnosis not present

## 2021-02-20 DIAGNOSIS — R262 Difficulty in walking, not elsewhere classified: Secondary | ICD-10-CM | POA: Diagnosis not present

## 2021-02-20 DIAGNOSIS — E875 Hyperkalemia: Secondary | ICD-10-CM | POA: Diagnosis not present

## 2021-02-20 DIAGNOSIS — S32591D Other specified fracture of right pubis, subsequent encounter for fracture with routine healing: Secondary | ICD-10-CM | POA: Diagnosis not present

## 2021-02-20 LAB — BASIC METABOLIC PANEL
Anion gap: 7 (ref 5–15)
BUN: 12 mg/dL (ref 8–23)
CO2: 21 mmol/L — ABNORMAL LOW (ref 22–32)
Calcium: 8.7 mg/dL — ABNORMAL LOW (ref 8.9–10.3)
Chloride: 102 mmol/L (ref 98–111)
Creatinine, Ser: 0.79 mg/dL (ref 0.44–1.00)
GFR, Estimated: >60 mL/min (ref >60)
Glucose, Bld: 90 mg/dL (ref 70–99)
Potassium: 4.9 mmol/L (ref 3.5–5.1)
Sodium: 130 mmol/L — ABNORMAL LOW (ref 135–145)

## 2021-02-20 MED ORDER — ACETAMINOPHEN 325 MG PO TABS: 650.0000 mg | ORAL_TABLET | Freq: Four times a day (QID) | ORAL | 1 refills | Status: DC | PRN

## 2021-02-20 MED ORDER — POTASSIUM CHLORIDE CRYS ER 10 MEQ PO TBCR: 10.0000 meq | EXTENDED_RELEASE_TABLET | ORAL | 1 refills | Status: DC

## 2021-02-20 NOTE — TOC Transition Note (Signed)
Transition of Care [facility]) - CM/SW Discharge Note   Patient Details  Name: Cathy [name] MRN: [id number] Date of Birth: 1941/01/31  [facility] of [facility]) CM/SW Contact:  Lawerance Sabal, RN Phone Number: 02/20/2021, 1:02 PM   Clinical Narrative:   Sherron Monday w patients son. He confirmed patient is from [facility].  Order placed for [facility] at his request, and per Dr Sharl Ma. Orders faxed. Son will transport home.      Barriers to Discharge: Continued Medical Work up   Patient Goals and CMS Choice        Discharge Placement                       Discharge Plan and Services In-house Referral: Clinical Social Work Discharge Planning Services: CM Consult                                 Social Determinants of Health (SDOH) Interventions     Readmission Risk Interventions No flowsheet data found.

## 2021-02-20 NOTE — Discharge Summary (Addendum)
Physician Discharge Summary  Cathy [name] UJW:[id number] DOB: 12/13/1940 DOA: 02/18/2021  PCP: Darrin Nipperollege, [facility] [place]  Admit date: 02/18/2021 Discharge date: 02/20/2021  Time spent: 50 minutes  Recommendations for Outpatient Follow-up:  1. Follow-up Dr. Aundria Rudogers in 3 weeks 2. Follow-up PCP in 1 week to check BMP as outpatient   Discharge Diagnoses:  Principal Problem:   Hyponatremia Active Problems:   Pubic ramus fracture (HCC)   Left rib fracture   Hyperkalemia   Sacral pressure ulcer   Lymphedema   Discharge Condition: Stable  Diet recommendation: Heart healthy diet  Filed Weights   02/18/21 1151  Weight: 54.4 kg    History of present illness:  80 year old female with medical history of hypertension, lymphedema presented with dizziness.  Symptoms started after she had breakfast.  Patient had a fall 3 weeks ago and landed on her tailbone.  Denies loss of consciousness.  In the ED she was found to have pubic ramus fracture and rib fractures.  Orthopedics was consulted and they recommended weightbearing as tolerated and follow-up in the office of Dr. Aundria Rudogers in 3 weeks.  Patient also found to be hyponatremic and started on IV normal saline.  Hospital Course:   1. Pubic ramus fractures-secondary to fall.  Patient fell 2 weeks ago.  X-ray shows displaced fractures with superior pubic rami, displaced left symphyseal pubic fracture.  Orthopedics was consulted and recommended weightbearing as tolerated.  Follow-up after results in clinic.  PT/OT has been consulted, no PT follow-up recommended.  Patient was supposed to start outpatient PT/OT which will be continued.  Pain is well controlled.  Take Tylenol as needed.  Weightbearing as tolerated.  2. Hyponatremia-patient presented with sodium 123, likely hypovolemic hyponatremia.  Patient is taking Lasix every other day at home.    Lasix was held, patient was started on IV normal saline.  Sodium has significantly  improved to 130.  She is asymptomatic.  Will discharge home.  PCP can follow-up in 1 week and recheck BMP.   3. Hyperkalemia-potassium i was elevated 5.2, patient takes potassium supplementation at home.  Recommend to change potassium supplementation to every other day to be taken along with Lasix.  4. Hypertension-blood pressure is stable.  Continue metoprolol, lisinopril.  5. Chronic lymphedema-stable.  Venous duplex of lower extremities were negative for DVT.  6. Portable chest x-ray showed possible mass in the lung.  Repeat two-view PA and lateral chest x-ray showed no acute abnormality.   Procedures:    Consultations:  Orthopedics  Discharge Exam: Vitals:   02/20/21 0752 02/20/21 1210  BP:  (!) 154/75  Pulse:  78  Resp:  16  Temp:  97.9 F (36.6 C)  SpO2: 95% 99%    General: Appears in no acute distress Cardiovascular: S1-S2, regular Respiratory: Clear to auscultation bilaterally  Discharge Instructions   Discharge Instructions    Diet - low sodium heart healthy   Complete by: As directed    Increase activity slowly   Complete by: As directed    No wound care   Complete by: As directed      Allergies as of 02/20/2021      Reactions   Sulfa Antibiotics Hives   Sulfamethoxazole-trimethoprim Rash      Medication List    TAKE these medications   acetaminophen 325 MG tablet Commonly known as: TYLENOL Take 2 tablets (650 mg total) by mouth every 6 (six) hours as needed for mild pain (or Fever >/= 101).   furosemide 20  MG tablet Commonly known as: LASIX Take 20 mg by mouth every other day.   Kapspargo Sprinkle 25 MG Cs24 Generic drug: Metoprolol Succinate Take 1 capsule by mouth daily.   lisinopril 20 MG tablet Commonly known as: ZESTRIL Take 1 tablet by mouth daily.   potassium chloride 10 MEQ tablet Commonly known as: KLOR-CON Take 1 tablet (10 mEq total) by mouth every other day. Take along with lasix What changed:   when to take  this  additional instructions      Allergies  Allergen Reactions  . Sulfa Antibiotics Hives  . Sulfamethoxazole-Trimethoprim Rash    Follow-up Information    Yolonda Kida, MD. Schedule an appointment as soon as possible for a visit in 3 week(s).   Specialty: Orthopedic Surgery Contact information: [address] [place] [address] [phone]                The results of significant diagnostics from this hospitalization (including imaging, microbiology, ancillary and laboratory) are listed below for reference.    Significant Diagnostic Studies: DG Chest 2 View  Result Date: 02/19/2021 CLINICAL DATA:  Altered mental status EXAM: CHEST - 2 VIEW COMPARISON:  02/18/2021 FINDINGS: Previously seen masslike density on chest x-ray is not borne out on today's exam and was not visualized on recent CT. Cardiac shadow is stable. Aortic calcifications are noted. Previously described left rib fractures are seen but less well visualized than on prior exam. No compression deformity is noted. IMPRESSION: No acute abnormality noted. Known left rib fractures are not as well visualized on today's exam. Electronically Signed   By: Alcide Clever M.D.   On: 02/19/2021 17:21   CT Head Wo Contrast  Result Date: 02/18/2021 CLINICAL DATA:  Mental status change EXAM: CT HEAD WITHOUT CONTRAST TECHNIQUE: Contiguous axial images were obtained from the base of the skull through the vertex without intravenous contrast. COMPARISON:  None. FINDINGS: Brain: Moderate cerebral atrophy. Ventricular enlargement consistent with the level of atrophy. Moderate white matter hypodensity bilaterally is symmetric. Negative for acute infarct, acute hemorrhage, mass Vascular: Negative for hyperdense vessel Skull: Negative Sinuses/Orbits: The paranasal sinuses are clear.  Negative orbit Other: None IMPRESSION: No acute abnormality Moderate atrophy and moderate chronic microvascular ischemic changes in  the white matter Electronically Signed   By: Marlan Palau M.D.   On: 02/18/2021 13:34   DG Pelvis Portable  Result Date: 02/18/2021 CLINICAL DATA:  Fall 2 weeks ago EXAM: PORTABLE PELVIS 1-2 VIEWS COMPARISON:  None. FINDINGS: Moderately displaced fracture of the right superior pubic ramus and parasymphyseal aspect of the right pubic bone. Nondisplaced fracture of the right inferior pubic ramus. Possible nondisplaced fracture involving the parasymphyseal aspect of the left pubic bone. Pubic symphysis intact without diastasis. SI joints appear intact without diastasis. No hip fracture or dislocation. IMPRESSION: 1. Moderately displaced fracture of the right superior pubic ramus and parasymphyseal aspect of the right pubic bone. 2. Nondisplaced fracture of the right inferior pubic ramus. 3. Possible nondisplaced fracture involving the parasymphyseal aspect of the left pubic bone. Electronically Signed   By: Duanne Guess D.O.   On: 02/18/2021 12:55   DG Chest Portable 1 View  Result Date: 02/18/2021 CLINICAL DATA:  Lightheadedness. EXAM: PORTABLE CHEST 1 VIEW COMPARISON:  None. FINDINGS: The heart size and mediastinal contours are within normal limits. Masslike opacity at the right lung apex may be secondary to overlap of the patient's head/neck in this region. Otherwise, no consolidation. No visible pleural effusions or pneumothorax  on the single AP semi erect radiograph. No acute osseous abnormality. IMPRESSION: 1. Masslike opacity at the right lung apex may be secondary to overlap of the patient's head/neck in this region; however, recommend dedicated PA and lateral radiographs with extension of the neck to better evaluate this area and exclude a mass. 2. Otherwise, no acute cardiopulmonary disease. Electronically Signed   By: Feliberto Harts MD   On: 02/18/2021 13:00   VAS [place] LOWER EXTREMITY VENOUS (DVT)  Result Date: 02/19/2021  Lower Venous DVT Study Patient Name:  Cathy [name]  Date of  Exam:   02/19/2021 Medical Rec #: [id number]          Accession #:    [id number] Date of Birth: Feb 08, 1941          Patient Gender: F Patient Age:   79Y Exam Location:  [facility] Procedure:      VAS [place] LOWER EXTREMITY VENOUS (DVT) Referring Phys: Gomez Cleverly Westley Blass --------------------------------------------------------------------------------  Indications: Edema.  Risk Factors: Patient has history of chronic lymphedema, keeps legs wrapped at home. Comparison Study: No prior study on file Performing Technologist: Sherren Kerns RVS  Examination Guidelines: A complete evaluation includes B-mode imaging, spectral Doppler, color Doppler, and power Doppler as needed of all accessible portions of each vessel. Bilateral testing is considered an integral part of a complete examination. Limited examinations for reoccurring indications may be performed as noted. The reflux portion of the exam is performed with the patient in reverse Trendelenburg.  +---------+---------------+---------+-----------+----------+------------------+ RIGHT    CompressibilityPhasicitySpontaneityPropertiesThrombus Aging     +---------+---------------+---------+-----------+----------+------------------+ CFV      Full                                         pulsatile waveform +---------+---------------+---------+-----------+----------+------------------+ SFJ      Full                                                            +---------+---------------+---------+-----------+----------+------------------+ FV Prox  Full                                                            +---------+---------------+---------+-----------+----------+------------------+ FV Mid   Full                                                            +---------+---------------+---------+-----------+----------+------------------+ FV DistalFull                                                             +---------+---------------+---------+-----------+----------+------------------+ PFV      Full                                                            +---------+---------------+---------+-----------+----------+------------------+  POP      Full           Yes      Yes                                     +---------+---------------+---------+-----------+----------+------------------+ PTV      Full                                                            +---------+---------------+---------+-----------+----------+------------------+ PERO     Full                                                            +---------+---------------+---------+-----------+----------+------------------+   +---------+---------------+---------+-----------+----------+------------------+ LEFT     CompressibilityPhasicitySpontaneityPropertiesThrombus Aging     +---------+---------------+---------+-----------+----------+------------------+ CFV      Full                                         pulsatile waveform +---------+---------------+---------+-----------+----------+------------------+ SFJ      Full                                                            +---------+---------------+---------+-----------+----------+------------------+ FV Prox  Full                                                            +---------+---------------+---------+-----------+----------+------------------+ FV Mid   Full                                                            +---------+---------------+---------+-----------+----------+------------------+ FV DistalFull                                                            +---------+---------------+---------+-----------+----------+------------------+ PFV      Full                                         pulsatile waveform +---------+---------------+---------+-----------+----------+------------------+ POP      Full            Yes      Yes                                     +---------+---------------+---------+-----------+----------+------------------+  Summary: RIGHT: - There is no evidence of deep vein thrombosis in the lower extremity.  - A cystic structure is found in the popliteal fossa.  LEFT: - There is no evidence of deep vein thrombosis in the lower extremity.  - A cystic structure is found in the popliteal fossa.  *See table(s) above for measurements and observations. Electronically signed by Sherald Hess MD on 02/19/2021 at 3:30:21 PM.    Final    CT CHEST ABDOMEN PELVIS WO CONTRAST  Result Date: 02/18/2021 CLINICAL DATA:  Fall.  Dizziness. EXAM: CT CHEST, ABDOMEN AND PELVIS WITHOUT CONTRAST TECHNIQUE: Multidetector CT imaging of the chest, abdomen and pelvis was performed following the standard protocol without IV contrast. COMPARISON:  Radiograph of same day. FINDINGS: CT CHEST FINDINGS Cardiovascular: Atherosclerosis of thoracic aorta is noted without aneurysm formation. Normal cardiac size. No pericardial effusion. Mild coronary artery calcifications are noted. Mediastinum/Nodes: Small sliding-type hiatal hernia is noted. No adenopathy is noted. Thyroid gland is unremarkable. Lungs/Pleura: Lungs are clear. No pleural effusion or pneumothorax. Musculoskeletal: Minimally displaced fractures are seen involving the lateral portion of the left seventh and eighth ribs. CT ABDOMEN PELVIS FINDINGS Hepatobiliary: No focal liver abnormality is seen. No gallstones, gallbladder wall thickening, or biliary dilatation. Pancreas: Unremarkable. No pancreatic ductal dilatation or surrounding inflammatory changes. Spleen: Normal in size without focal abnormality. Adrenals/Urinary Tract: Adrenal glands are unremarkable. Kidneys are normal, without renal calculi, focal lesion, or hydronephrosis. Bladder is unremarkable. Stomach/Bowel: Stomach is within normal limits. Appendix appears normal. No evidence of bowel wall  thickening, distention, or inflammatory changes. Sigmoid diverticulosis is noted without inflammation. Vascular/Lymphatic: Aortic atherosclerosis. No enlarged abdominal or pelvic lymph nodes. Reproductive: Uterus and bilateral adnexa are unremarkable. Other: No abdominal wall hernia or abnormality. No abdominopelvic ascites. Musculoskeletal: Mildly displaced fractures are seen involving the right superior and inferior pubic rami. Nondisplaced left parasymphyseal pubic fracture is noted. IMPRESSION: Minimally displaced fractures are seen involving the left seventh and eighth ribs. Mildly displaced fractures are seen involving the right superior and inferior pubic rami. Nondisplaced left parasymphyseal pubic fracture is noted. Small sliding-type hiatal hernia. Mild coronary artery calcifications are noted. Sigmoid diverticulosis is noted without inflammation. Aortic Atherosclerosis (ICD10-I70.0). Electronically Signed   By: Lupita Raider M.D.   On: 02/18/2021 14:40    Microbiology: Recent Results (from the past 240 hour(s))  Resp Panel by RT-PCR (Flu A&B, Covid) Nasopharyngeal Swab     Status: None   Collection Time: 02/18/21 12:27 PM   Specimen: Nasopharyngeal Swab; Nasopharyngeal(NP) swabs in vial transport medium  Result Value Ref Range Status   SARS Coronavirus 2 by RT PCR NEGATIVE NEGATIVE Final    Comment: (NOTE) SARS-CoV-2 target nucleic acids are NOT DETECTED.  The SARS-CoV-2 RNA is generally detectable in upper respiratory specimens during the acute phase of infection. The lowest concentration of SARS-CoV-2 viral copies this assay can detect is 138 copies/mL. A negative result does not preclude SARS-Cov-2 infection and should not be used as the sole basis for treatment or other patient management decisions. A negative result may occur with  improper specimen collection/handling, submission of specimen other than nasopharyngeal swab, presence of viral mutation(s) within the areas  targeted by this assay, and inadequate number of viral copies(<138 copies/mL). A negative result must be combined with clinical observations, patient history, and epidemiological information. The expected result is Negative.  Fact Sheet for Patients:  BloggerCourse.com  Fact Sheet for Healthcare Providers:  SeriousBroker.it  This test is no t yet approved or cleared by  the [employer] and  has been authorized for detection and/or diagnosis of SARS-CoV-2 by FDA under an Emergency Use Authorization (EUA). This EUA will remain  in effect (meaning this test can be used) for the duration of the COVID-19 declaration under Section 564(b)(1) of the Act, 21 U.S.C.section 360bbb-3(b)(1), unless the authorization is terminated  or revoked sooner.       Influenza A by PCR NEGATIVE NEGATIVE Final   Influenza B by PCR NEGATIVE NEGATIVE Final    Comment: (NOTE) The Xpert Xpress SARS-CoV-2/FLU/RSV plus assay is intended as an aid in the diagnosis of influenza from Nasopharyngeal swab specimens and should not be used as a sole basis for treatment. Nasal washings and aspirates are unacceptable for Xpert Xpress SARS-CoV-2/FLU/RSV testing.  Fact Sheet for Patients: BloggerCourse.com  Fact Sheet for Healthcare Providers: SeriousBroker.it  This test is not yet approved or cleared by the [place] FDA and has been authorized for detection and/or diagnosis of SARS-CoV-2 by FDA under an Emergency Use Authorization (EUA). This EUA will remain in effect (meaning this test can be used) for the duration of the COVID-19 declaration under Section 564(b)(1) of the Act, 21 U.S.C. section 360bbb-3(b)(1), unless the authorization is terminated or revoked.  Performed at [facility], [address], [place], [place] [address]      Labs: Basic Metabolic Panel: Recent Labs  Lab  02/18/21 1217 02/18/21 1928 02/19/21 0120 02/19/21 0806 02/19/21 1436 02/20/21 0205  NA 123* 125* 128* 129* 129* 130*  K 5.2* 5.0 4.7 4.5 4.7 4.9  CL 93* 94* 95* 102 101 102  CO2 24 25 24  21* 23 21*  GLUCOSE 94 126* 94 89 217* 90  BUN 17 14 13 13 11 12   CREATININE 0.94 0.87 0.84 0.75 0.84 0.79  CALCIUM 8.9 8.7* 8.7* 8.7* 8.8* 8.7*  MG 2.0  --   --   --   --   --    Liver Function Tests: Recent Labs  Lab 02/18/21 1217  AST 32  ALT 19  ALKPHOS 133*  BILITOT 0.7  PROT 7.4  ALBUMIN 3.5   No results for input(s): LIPASE, AMYLASE in the last 168 hours. No results for input(s): AMMONIA in the last 168 hours. CBC: Recent Labs  Lab 02/18/21 1217 02/19/21 0120  WBC 6.0 6.6  NEUTROABS 3.8  --   HGB 11.7* 11.6*  HCT 34.9* 34.2*  MCV 90.9 90.2  PLT 294 275    CBG: Recent Labs  Lab 02/18/21 1203  GLUCAP 108*       Signed:  02/21/21 MD.  [facility] 02/20/2021, 1:22 PM

## 2021-02-20 NOTE — Plan of Care (Signed)

## 2021-02-20 NOTE — Evaluation (Signed)
Occupational Therapy Evaluation/Discharge Patient Details Name: Cathy [name] MRN: [id number] DOB: June 12, 1941 Today's Date: 02/20/2021    History of Present Illness pt is a 80 y/o female presenting 5/27 with dizziness.  She recently slipped off the bed2-3 wks ago onto her "tailbone", now with c/o pain and difficulty walking.  Imaging showing L seventh and eighth rib fx's, right sup/in pubic rami fx's and a nondisplaced L symphyseal pubic fracture.  PMHx:  HTN   Clinical Impression   PTA, pt lives at ILF and reports Modified Independence with ADLs and mobility using Rollator. Pt goes to dining halls for meals and walks her dachshund, Rascal, 2-3x/day. Pt presents now close to baseline for daily tasks, able to complete ADLs at sink and toileting task with Modified Independence. Pt with slow, cautious pace (also contributed to soreness) but no overt LOB or safety concerns noted. Anticipate pt to make quick return to full baseline functioning without need for OT follow-up. Pt reports her son plans to care for her dog until she feels confident enough to begin walking him again. Pt verbalized understanding of all education with OT to sign off at acute level.     Follow Up Recommendations  No OT follow up;Supervision - Intermittent    Equipment Recommendations  None recommended by OT    Recommendations for Other Services       Precautions / Restrictions Precautions Precautions: Fall Restrictions Weight Bearing Restrictions: Yes RLE Weight Bearing: Weight bearing as tolerated LLE Weight Bearing: Weight bearing as tolerated Other Position/Activity Restrictions: WBAT B LE per ortho note      Mobility Bed Mobility Overal bed mobility: Needs Assistance Bed Mobility: Supine to Sit     Supine to sit: Modified independent (Device/Increase time);HOB elevated     General bed mobility comments: increased time, use of bedrail    Transfers Overall transfer level: Needs  assistance Equipment used: 4-wheeled walker Transfers: Sit to/from [employer] Transfers Sit to Stand: Modified independent (Device/Increase time) Stand pivot transfers: Supervision       General transfer comment: cues for locking brakes of Rollator at times but overall no physical assist needd or large safety concerns    Balance Overall balance assessment: Needs assistance Sitting-balance support: No upper extremity supported Sitting balance-Leahy Scale: Good     Standing balance support: No upper extremity supported;Bilateral upper extremity supported Standing balance-Leahy Scale: Fair Standing balance comment: fair static standing at sink, use of B UE support for mobility                           ADL either performed or assessed with clinical judgement   ADL Overall ADL's : Modified independent                                       General ADL Comments: Able to complete bathing tasks, grooming tasks standing at sink with Rollator, mobility to/from bathroom and toileting task, no assist needed and no overt LOB. Pt with slow, cautious pace but also reports this is due to soreness     Vision Patient Visual Report: No change from baseline Vision Assessment?: No apparent visual deficits     Perception     Praxis      Pertinent Vitals/Pain Pain Assessment: Faces Faces Pain Scale: Hurts a little bit Pain Location: pelvis Pain Descriptors / Indicators: Sore Pain Intervention(s): Monitored during  session     Hand Dominance Right   Extremity/Trunk Assessment Upper Extremity Assessment Upper Extremity Assessment: Overall WFL for tasks assessed   Lower Extremity Assessment Lower Extremity Assessment: Defer to PT evaluation   Cervical / Trunk Assessment Cervical / Trunk Assessment: Kyphotic   Communication Communication Communication: No difficulties   Cognition Arousal/Alertness: Awake/alert Behavior During Therapy: WFL for tasks  assessed/performed Overall Cognitive Status: Within Functional Limits for tasks assessed                                     General Comments       Exercises     Shoulder Instructions      Home Living Family/patient expects to be discharged to:: Other (Comment) (ILF) Living Arrangements: Alone Available Help at Discharge: Other (Comment) (available staff) Type of Home: Apartment Home Access: Level entry     Home Layout: One level               Home Equipment: Walker - 4 wheels;Grab bars - toilet;Grab bars - tub/shower;Shower seat          Prior Functioning/Environment Level of Independence: Independent with assistive device(s)        Comments: can complete her own ADL's, walks with a rollator in apt and to the dining room before the fall. Walks her small dog, Rascal        OT Problem List: Decreased activity tolerance;Impaired balance (sitting and/or standing);Pain      OT Treatment/Interventions:      OT Goals(Current goals can be found in the care plan section) Acute Rehab OT Goals Patient Stated Goal: be able to return to normal mobility and be able to walk dog safely OT Goal Formulation: All assessment and education complete, DC therapy  OT Frequency:     Barriers to D/C:            Co-evaluation              AM-PAC OT "6 Clicks" Daily Activity     Outcome Measure Help from another person eating meals?: None Help from another person taking care of personal grooming?: None Help from another person toileting, which includes using toliet, bedpan, or urinal?: None Help from another person bathing (including washing, rinsing, drying)?: None Help from another person to put on and taking off regular upper body clothing?: None Help from another person to put on and taking off regular lower body clothing?: None 6 Click Score: 24   End of Session Equipment Utilized During Treatment: Other (comment) (rollator)  Activity Tolerance:  Patient tolerated treatment well Patient left: in chair;with call bell/phone within reach;Other (comment) (no chair alarm in room)  OT Visit Diagnosis: Other abnormalities of gait and mobility (R26.89);Pain Pain - Right/Left:  (bilateral) Pain - part of body: Hip                Time: 0454-0981 OT Time Calculation (min): 39 min Charges:  OT General Charges $OT Visit: 1 Visit OT Evaluation $OT Eval Low Complexity: 1 Low OT Treatments $Self Care/Home Management : 8-22 mins $Therapeutic Activity: 8-22 mins  Bradd Canary, OTR/L Acute Rehab Services Office: [phone]  Lorre Munroe 02/20/2021, 10:19 AM

## 2021-11-23 ENCOUNTER — Other Ambulatory Visit: Payer: Self-pay | Admitting: Internal Medicine

## 2021-11-23 DIAGNOSIS — E559 Vitamin D deficiency, unspecified: Secondary | ICD-10-CM

## 2021-11-23 DIAGNOSIS — N179 Acute kidney failure, unspecified: Secondary | ICD-10-CM

## 2021-11-23 DIAGNOSIS — I129 Hypertensive chronic kidney disease with stage 1 through stage 4 chronic kidney disease, or unspecified chronic kidney disease: Secondary | ICD-10-CM

## 2021-11-23 DIAGNOSIS — I890 Lymphedema, not elsewhere classified: Secondary | ICD-10-CM

## 2021-11-23 DIAGNOSIS — I509 Heart failure, unspecified: Secondary | ICD-10-CM

## 2021-12-01 ENCOUNTER — Ambulatory Visit: Payer: Medicare Other | Admitting: Occupational Therapy

## 2021-12-02 ENCOUNTER — Other Ambulatory Visit: Payer: Medicare Other

## 2021-12-06 ENCOUNTER — Ambulatory Visit: Payer: Medicare Other | Admitting: Occupational Therapy

## 2021-12-07 ENCOUNTER — Other Ambulatory Visit: Payer: Self-pay

## 2021-12-07 ENCOUNTER — Encounter: Payer: Self-pay | Admitting: Occupational Therapy

## 2021-12-07 ENCOUNTER — Ambulatory Visit: Payer: Medicare Other | Attending: Family Medicine | Admitting: Occupational Therapy

## 2021-12-07 DIAGNOSIS — I890 Lymphedema, not elsewhere classified: Secondary | ICD-10-CM | POA: Diagnosis present

## 2021-12-08 ENCOUNTER — Encounter: Payer: Self-pay | Admitting: Occupational Therapy

## 2021-12-08 ENCOUNTER — Ambulatory Visit
Admission: RE | Admit: 2021-12-08 | Discharge: 2021-12-08 | Disposition: A | Discharge status: Home / Self Care | Payer: Medicare Other | Source: Ambulatory Visit | Attending: Internal Medicine | Admitting: Internal Medicine

## 2021-12-08 ENCOUNTER — Encounter: Payer: Medicare Other | Admitting: Occupational Therapy

## 2021-12-08 DIAGNOSIS — E559 Vitamin D deficiency, unspecified: Secondary | ICD-10-CM

## 2021-12-08 DIAGNOSIS — N179 Acute kidney failure, unspecified: Secondary | ICD-10-CM

## 2021-12-08 DIAGNOSIS — I129 Hypertensive chronic kidney disease with stage 1 through stage 4 chronic kidney disease, or unspecified chronic kidney disease: Secondary | ICD-10-CM

## 2021-12-08 DIAGNOSIS — I509 Heart failure, unspecified: Secondary | ICD-10-CM

## 2021-12-08 DIAGNOSIS — I890 Lymphedema, not elsewhere classified: Secondary | ICD-10-CM

## 2021-12-08 NOTE — Therapy (Signed)
[facility] ?[place], [place], [address] ?Phone: [phone]   Fax:  [phone] ? ?Occupational Therapy Evaluation ? ?Patient Details  ?Name: Cathy [name] ?MRN: [id number] ?Date of Birth: 1941-04-16 ?Referring Provider (OT): Almedia Balls, NP ? ? ?Encounter Date: 12/07/2021 ? ? OT End of Session - 12/08/21 1133   ? ? Visit Number 1   ? Number of Visits 1   ? Date for OT Re-Evaluation 12/07/21   ? OT Start Time 0300   ? OT Stop Time 0415   ? OT Time Calculation (min) 75 min   ? Activity Tolerance Patient tolerated treatment well;Treatment limited secondary to medical complications (Comment)   open wounds on LLE below the knee. Lymphorrhea and skin  ? ?  ?  ? ?  ? ? ?Past Medical History:  ?Diagnosis Date  ? Hypertension   ? ? ?History reviewed. No pertinent surgical history. ? ?There were no vitals filed for this visit. ? ? Subjective Assessment - 12/07/21 1505   ? ? Subjective  Cathy [name] is referred to Occupational Therapy by Almedia Balls, NP, to evaluate and treat BLE lymphedema. She presents seated in a transport wheelchair and is accompanied by her adult son, Cathy [name]. Pt presents with LLE pants leg rolled up revealing obvious chronic lymphorrhea and associated skin breakdown. Skin maceration, flaking and redness Pt reports leg swelling started about 5 years ago without known precipitating events. Pt was issued with "neoprene Velcro leg wraps"  when she lived in [place], but she did not undergo formal lymphedema treatment in the past. Around Christmastime this year she injured her R leg, which resulted in lymphedema exacerbation, progression, open wounds, cellulitis and lymphorrhea. Pt's goal for therapy is not to lose her balance and not to fall any   ? Patient is accompanied by: Family member   son, Cathy [name]  ? Pertinent History Relevant to LE: HTN, hx cellulitis, CHF, BLE lymphedema w/ skin breakdown on the L     below the  knee today, s/p pubic ramus fx, abnormal gait, kidney function tests pending   ? Limitations difficulty walking, hx of falls,   ? Special Tests no FOTO. TBA at initial Rx visit   ? Currently in Pain? No/denies   ? Pain Type --   ? Pain Onset --   ? Pain Frequency Intermittent   ? Aggravating Factors  sleeping on it wrong   ? Pain Relieving Factors sleeping on my back   ? ?  ?  ? ?  ? ? ? ? [facility] OT Assessment - 12/07/21 1553   ? ?  ? Assessment  ? Medical Diagnosis Moderate Stage II BLE Lymphedema, R>L   ? Referring Provider (OT) Almedia Balls, NP   ? Prior Therapy no prior lymphedema therapy, has compression garment alternatives- but not worn regularly. Not sure if worn out. Not available for assessment.   ?  ? Precautions  ? Precautions Fall   ?  ? Balance Screen  ? Has the patient fallen in the past 6 months No   ? Has the patient had a decrease in activity level because of a fear of falling?  Yes   ? Is the patient reluctant to leave their home because of a fear of falling?  No   ?  ? Home  Environment  ? Family/patient expects to be discharged to: Other (comment)   ? Alternate Level Stairs - Number of Steps Independent living  appartment in retirement community   ? Bathroom Shower/Tub Tub/Shower unit   ? Lives With Alone   ?  ? Prior Function  ? Level of Independence Requires assistive device for independence;Needs assistance with ADLs;Needs assistance with homemaking;Needs assistance with gait;Needs assistance with transfers   ? Vocation Retired;Other (comment)   former Education officer, museum  ? Leisure friends, family. Pt enjoys and is active with social life at residential community   ?  ? IADL  ? Shopping Needs to be accompanied on any shopping trip   ? Light Housekeeping Does not participate in any housekeeping tasks   ? Meal Prep Needs to have meals prepared and served   ? Community Mobility Relies on family or friends for transportation   ?  ? Mobility  ? Mobility Status History of falls   ? Mobility Status  Comments uses rolling walker at apartment. Able to walk on level carpeted floor > 100 ft to community dining room with extra time\   ?  ? Activity Tolerance  ? Activity Tolerance Comments standing and walking tolerance impaired as well as endurance   ?  ? Cognition  ? Overall Cognitive Status Difficult to assess   ?  ? Observation/Other Assessments  ? Focus on Therapeutic Outcomes (FOTO)  TBA   ?  ? Posture/Postural Control  ? Posture/Postural Control Postural limitations   ? Postural Limitations Forward head;Rounded Shoulders;Increased thoracic kyphosis;Posterior pelvic tilt   ?  ? Sensation  ? Light Touch Not tested   ?  ? ROM / Strength  ? AROM / PROM / Strength Strength   ?  ? Strength  ? Overall Strength Deficits   ? Overall Strength Comments deconditioning   ? Strength Assessment Site Hip;Knee;Ankle   ? ?  ?  ? ?  ? ?oderate, Stage II, secondary Lymphedema ? ?Skin  ?Description Hyper-Keratosis Peau? de Orange Shiny Tight Fibrotic/ ?Indurated Fatty Doughy Spongy/ boggy  ?   x x x   spongy  ? ?Skin dry Flaky WNL Macerated  ? x   x  ? ?Color Redness Present Varicosities Blanching Hemosiderin Staining Mottled  ? x  x   ?  ? ?Odor Malodorous Yeast Fungal infection  Absent  ?    x  ? ?Temperature Warm Cool wnl  ? x   ?  ? ?Pitting Edema ? ? 1+ 2+ 3+ 4+ Non-pitting  ?   ?   ? x  ? ?Girth Symmetrical Asymmetrical                   Distribution  ? x  ?  ?Ankles to distal thighs ? ? ? ? ? ? ?  ? ?Stemmer Sign Positive Negative  ?  x  ? ?Lymphorrhea History Of:  Present Absent  ? x x  ?  ? ?Wounds History Of Present Absent Venous Arterial Pressure Mechanical  ?  X ?LLE below knee presents with Large flakes of skin macerated and wet with lymphorrhea, and encrusted , dried lymphorrhea stock on skin over large surface area      ?  ? ?Signs of Infection Redness Warmth Erythema Acute Swelling Drainage Borders  ? x x   x   ?        ? ? ?Sensation Light Touch Deep pressure Hypersensitivty  ? Present Impaired Present Impaired  Absent Impaired  ?  x      ? ? ?Nails WNL ?  Fungus nail dystrophy  ?     ? ?  Hair Growth Symmetrical Asymmetrical  ?    ? ?Skin Creases Base of toes ? Ankles ? ? Base of Fingers knees ?      Abdominal pannus Thigh Lobules ? Face/neck  ? x x  x     ? ? ? ? ? ? ? ? ? ? OT Treatments/Exercises (OP) - 12/08/21 1212   ? ?  ? Transfers  ? Transfers Not assessed   ?  ? ADLs  ? LB Dressing difficulty fitting street shoes and LB clothing over swollen, weeping LLE. Difficuty donning and doffing LB clothing. Pt unable to done/ doff  traditional elastic compression stockings   ? Bathing difficulty reaching distal legs and feet to bath   ? Functional Mobility impaired   ? ADL Comments needs assistance with wound care. Pt unable to reach distal legs and feet   ?  ? Manual Therapy  ? Manual Therapy Edema management   ? ?  ?  ? ?  ? ? ? ? ? ? ? ? ? OT Education - 12/08/21 1301   ? ? Education Details Provided Pt and Family education regarding lymphatic structure and function, etiology, onset patterns and stages of progression. Taught interaction between blood circulatory system and lymphatic circulation.Discussed  impact of gravity and co-morbidities on lymphatic function. Outlined Complete Decongestive Therapy (CDT)  as standard of care and provided in depth information regarding 4 primary components of Intensive and Self Management Phases, including Manual Lymph Drainage (MLD), compression wrapping and garments, skin care, and therapeutic exercise. Pilar Plate discussion with re need for frequent attendance and high burden of care when caregiver is needed, impact of co morbidities. We discussed  the chronic, progressive nature of lymphedema and Importance of daily, ongoing LE self-care essential for limiting progression and infection risk. Discussed need for Pt to be accompanied to all OT visits for LE care by family member or caregiver to assist her with bathroom breaks. Discussed need to have daily caregiver assistance with  lymphedema self-care home program due to Pt's inability to reach feet and legs to perform compression bandaging, skin care, simple self MLD and there ex throughout Intensive Phase and Self-Management Phases of CDT. Witho

## 2021-12-08 NOTE — Patient Instructions (Signed)
Request wound care referral from your doctor. Skin breakdown in the context of chronic  lymphorrhea and elevated cellulitis risk must be resolved before we can commence OT for CDT.  ?

## 2021-12-12 ENCOUNTER — Encounter: Payer: Medicare Other | Admitting: Occupational Therapy

## 2021-12-13 ENCOUNTER — Encounter: Payer: Medicare Other | Admitting: Occupational Therapy

## 2021-12-15 ENCOUNTER — Encounter: Payer: Medicare Other | Admitting: Occupational Therapy

## 2021-12-16 ENCOUNTER — Encounter: Payer: Medicare Other | Admitting: Occupational Therapy

## 2021-12-20 ENCOUNTER — Encounter: Payer: Medicare Other | Admitting: Occupational Therapy

## 2021-12-21 ENCOUNTER — Encounter: Payer: Medicare Other | Admitting: Occupational Therapy

## 2021-12-22 ENCOUNTER — Encounter: Payer: Medicare Other | Admitting: Occupational Therapy

## 2021-12-26 ENCOUNTER — Encounter: Payer: Medicare Other | Admitting: Occupational Therapy

## 2021-12-27 ENCOUNTER — Encounter: Payer: Medicare Other | Admitting: Occupational Therapy

## 2021-12-28 ENCOUNTER — Encounter (HOSPITAL_BASED_OUTPATIENT_CLINIC_OR_DEPARTMENT_OTHER): Payer: Medicare Other | Attending: General Surgery | Admitting: General Surgery

## 2021-12-28 ENCOUNTER — Encounter: Payer: Medicare Other | Admitting: Occupational Therapy

## 2021-12-28 DIAGNOSIS — I5032 Chronic diastolic (congestive) heart failure: Secondary | ICD-10-CM | POA: Diagnosis not present

## 2021-12-28 DIAGNOSIS — L97321 Non-pressure chronic ulcer of left ankle limited to breakdown of skin: Secondary | ICD-10-CM | POA: Insufficient documentation

## 2021-12-28 DIAGNOSIS — I110 Hypertensive heart disease with heart failure: Secondary | ICD-10-CM | POA: Insufficient documentation

## 2021-12-28 DIAGNOSIS — I890 Lymphedema, not elsewhere classified: Secondary | ICD-10-CM | POA: Insufficient documentation

## 2021-12-28 DIAGNOSIS — L97221 Non-pressure chronic ulcer of left calf limited to breakdown of skin: Secondary | ICD-10-CM | POA: Insufficient documentation

## 2021-12-28 DIAGNOSIS — L97212 Non-pressure chronic ulcer of right calf with fat layer exposed: Secondary | ICD-10-CM | POA: Insufficient documentation

## 2021-12-28 NOTE — Progress Notes (Signed)
Standing, Cathy C. ([id number]) ?Visit Report for 12/28/2021 ?Abuse Risk Screen Details ?Patient Name: Date of Service: ?[name], MA RGA RET C. 12/28/2021 1:30 PM ?Medical Record Number: [id number] ?Patient Account Number: [id number] ?Date of Birth/Sex: Treating RN: ?Jan 26, 1941 (81 y.o. Cathy [name], Cathy [name] ?Primary Care Jourdan Durbin: Donald Prose Other Clinician: ?Referring Charnese Federici: ?Treating Maybree Riling/Extender: Fredirick Maudlin ?Almedia Balls ?Weeks in Treatment: 0 ?Abuse Risk Screen Items ?Answer ?ABUSE RISK SCREEN: ?Has anyone close to you tried to hurt or harm you recentlyo No ?Do you feel uncomfortable with anyone in your familyo No ?Has anyone forced you do things that you didnt want to doo No ?Electronic Signature(s) ?Signed: 12/28/2021 7:00:57 PM By: Levan Hurst RN, BSN ?Entered By: Levan Hurst on 12/28/2021 14:43:19 ?-------------------------------------------------------------------------------- ?Activities of Daily Living Details ?Patient Name: Date of Service: ?[name], MA RGA RET C. 12/28/2021 1:30 PM ?Medical Record Number: [id number] ?Patient Account Number: [id number] ?Date of Birth/Sex: Treating RN: ?Jan 31, 1941 (81 y.o. Cathy [name], Cathy [name] ?Primary Care Dallana Mavity: Donald Prose Other Clinician: ?Referring Ledon Weihe: ?Treating Keya Wynes/Extender: Fredirick Maudlin ?Almedia Balls ?Weeks in Treatment: 0 ?Activities of Daily Living Items ?Answer ?Activities of Daily Living (Please select one for each item) ?Drive Automobile Not Able ?T Medications ?ake Completely Able ?Use T elephone Completely Able ?Care for Appearance Completely Able ?Use T oilet Completely Able ?Bath / Shower Completely Able ?Dress Self Completely Able ?Feed Self Completely Able ?Walk Completely Able ?Get In / Out Bed Completely Able ?Housework Completely Able ?Prepare Meals Need Assistance ?Handle Money Completely Able ?Shop for Self Need Assistance ?Electronic Signature(s) ?Signed: 12/28/2021 7:00:57 PM By: Levan Hurst RN, BSN ?Entered By: Levan Hurst on 12/28/2021 14:45:04 ?-------------------------------------------------------------------------------- ?Education Screening Details ?Patient Name: ?Date of Service: ?[name], MA RGA RET C. 12/28/2021 1:30 PM ?Medical Record Number: [id number] ?Patient Account Number: [id number] ?Date of Birth/Sex: ?Treating RN: ?1941/04/11 (81 y.o. Cathy [name], Cathy [name] ?Primary Care Shayanna Thatch: Donald Prose ?Other Clinician: ?Referring Mateja Dier: ?Treating Dorrian Doggett/Extender: Fredirick Maudlin ?Almedia Balls ?Weeks in Treatment: 0 ?Primary Learner Assessed: Patient ?Learning Preferences/Education Level/Primary Language ?Learning Preference: Explanation, Demonstration, Printed Material ?Highest Education Level: High School ?Preferred Language: English ?Cognitive Barrier ?Language Barrier: No ?Translator Needed: No ?Memory Deficit: No ?Emotional Barrier: No ?Cultural/Religious Beliefs Affecting Medical Care: No ?Physical Barrier ?Impaired Vision: No ?Impaired Hearing: No ?Decreased Hand dexterity: No ?Knowledge/Comprehension ?Knowledge Level: High ?Comprehension Level: High ?Ability to understand written instructions: High ?Ability to understand verbal instructions: High ?Motivation ?Anxiety Level: Calm ?Cooperation: Cooperative ?Education Importance: Acknowledges Need ?Interest in Health Problems: Asks Questions ?Perception: Coherent ?Willingness to Engage in Self-Management High ?Activities: ?Readiness to Engage in Self-Management High ?Activities: ?Electronic Signature(s) ?Signed: 12/28/2021 7:00:57 PM By: Levan Hurst RN, BSN ?Entered By: Levan Hurst on 12/28/2021 14:45:23 ?-------------------------------------------------------------------------------- ?Fall Risk Assessment Details ?Patient Name: ?Date of Service: ?[name], MA RGA RET C. 12/28/2021 1:30 PM ?Medical Record Number: [id number] ?Patient Account Number: [id number] ?Date of Birth/Sex: ?Treating RN: ?1941/09/03 (81 y.o. Cathy [name], Cathy [name] ?Primary Care Shayla Heming: Donald Prose ?Other Clinician: ?Referring Hien Cunliffe: ?Treating Vivica Dobosz/Extender: Fredirick Maudlin ?Almedia Balls ?Weeks in Treatment: 0 ?Fall Risk Assessment Items ?Have you had 2 or more falls in the last 12 monthso 0 Yes ?Have you had any fall that resulted in injury in the last 12 monthso 0 Yes ?FALLS RISK SCREEN ?History of falling - immediate or within 3 months 0 No ?Secondary diagnosis (Do you have 2 or more medical diagnoseso) 15 Yes ?Ambulatory aid ?None/bed rest/wheelchair/nurse 0 No ?Crutches/cane/walker 15 Yes ?Furniture 0 No ?Intravenous therapy Access/Saline/Heparin Lock 0 No ?Gait/Transferring ?Normal/ bed  rest/ wheelchair 0 Yes ?Weak (short steps with or without shuffle, stooped but able to lift head while walking, may seek 0 No ?support from furniture) ?Impaired (short steps with shuffle, may have difficulty arising from chair, head down, impaired 0 No ?balance) ?Mental Status ?Oriented to own ability 0 Yes ?Electronic Signature(s) ?Signed: 12/28/2021 7:00:57 PM By: Levan Hurst RN, BSN ?Entered By: Levan Hurst on 12/28/2021 14:46:01 ?-------------------------------------------------------------------------------- ?Foot Assessment Details ?Patient Name: ?Date of Service: ?[name], MA RGA RET C. 12/28/2021 1:30 PM ?Medical Record Number: [id number] ?Patient Account Number: [id number] ?Date of Birth/Sex: ?Treating RN: ?11-Aug-1941 (81 y.o. Cathy [name], Cathy [name] ?Primary Care Lear Carstens: Donald Prose ?Other Clinician: ?Referring Chloe Flis: ?Treating Rickayla Wieland/Extender: Fredirick Maudlin ?Almedia Balls ?Weeks in Treatment: 0 ?Foot Assessment Items ?Site Locations ?+ = Sensation present, - = Sensation absent, C = Callus, U = Ulcer ?R = Redness, W = Warmth, M = Maceration, PU = Pre-ulcerative lesion ?F = Fissure, S = Swelling, D = Dryness ?Assessment ?Right: Left: ?Other Deformity: No No ?Prior Foot Ulcer: No No ?Prior Amputation: No No ?Charcot Joint: No No ?Ambulatory Status: Ambulatory With Help ?Assistance Device:  Walker ?Gait: Steady ?Electronic Signature(s) ?Signed: 12/28/2021 7:00:57 PM By: Levan Hurst RN, BSN ?Entered By: Levan Hurst on 12/28/2021 14:46:59 ?-------------------------------------------------------------------------------- ?Nutrition Risk Screening Details ?Patient Name: ?Date of Service: ?[name], MA RGA RET C. 12/28/2021 1:30 PM ?Medical Record Number: [id number] ?Patient Account Number: [id number] ?Date of Birth/Sex: ?Treating RN: ?[birth year] (81 y.o. Cathy [name], Cathy [name] ?Primary Care Ravina Milner: Donald Prose ?Other Clinician: ?Referring Jolane Bankhead: ?Treating Mindy Behnken/Extender: Fredirick Maudlin ?Almedia Balls ?Weeks in Treatment: 0 ?Height (in): 58 ?Weight (lbs): 138 ?Body Mass Index (BMI): 28.8 ?Nutrition Risk Screening Items ?Score Screening ?NUTRITION RISK SCREEN: ?I have an illness or condition that made me change the kind and/or amount of food I eat 0 No ?I eat fewer than two meals per day 0 No ?I eat few fruits and vegetables, or milk products 0 No ?I have three or more drinks of beer, liquor or wine almost every day 0 No ?I have tooth or mouth problems that make it hard for me to eat 0 No ?I don't always have enough money to buy the food I need 0 No ?I eat alone most of the time 0 No ?I take three or more different prescribed or over-the-counter drugs a day 1 Yes ?Without wanting to, I have lost or gained 10 pounds in the last six months 0 No ?I am not always physically able to shop, cook and/or feed myself 2 Yes ?Nutrition Protocols ?Good Risk Protocol ?Moderate Risk Protocol 0 Provide education on nutrition ?High Risk Proctocol ?Risk Level: Moderate Risk ?Score: 3 ?Electronic Signature(s) ?Signed: 12/28/2021 7:00:57 PM By: Levan Hurst RN, BSN ?Entered By: Levan Hurst on 12/28/2021 14:46:36 ?

## 2021-12-28 NOTE — Progress Notes (Addendum)
[name], [name] ([id number]) ?Visit Report for 12/28/2021 ?Chief Complaint Document Details ?Patient Name: Date of Service: ?[name], MA RGA RET C. 12/28/2021 1:30 PM ?Medical Record Number: [id number] ?Patient Account Number: [id number] ?Date of Birth/Sex: Treating RN: ?09-Jun-1941 (81 y.o. F) ?Primary Care Provider: Donald [name] Other Clinician: ?Referring Provider: ?Treating Provider/Extender: Cathy [name] ?Cathy [name] ?Weeks in Treatment: 0 ?Information Obtained from: Patient ?Chief Complaint ?Patient seen for complaints of Non-Healing Wounds. ?Electronic Signature(s) ?Signed: 12/28/2021 5:05:35 PM By: Cathy Maudlin MD FACS ?Entered By: Cathy [name] on 12/28/2021 17:05:35 ?-------------------------------------------------------------------------------- ?Debridement Details ?Patient Name: Date of Service: ?[name], MA RGA RET C. 12/28/2021 1:30 PM ?Medical Record Number: [id number] ?Patient Account Number: [id number] ?Date of Birth/Sex: Treating RN: ?08/25/1941 (81 y.o. Cathy [name] ?Primary Care Provider: Donald [name] Other Clinician: ?Referring Provider: ?Treating Provider/Extender: Cathy [name] ?Cathy [name] ?Weeks in Treatment: 0 ?Debridement Performed for Assessment: Wound #1 Right,Circumferential Lower Leg ?Performed By: Physician Cathy Maudlin, MD ?Debridement Type: Debridement ?Severity of Tissue Pre Debridement: Fat layer exposed ?Level of Consciousness (Pre-procedure): Awake and Alert ?Pre-procedure Verification/Time Out Yes - 15:12 ?Taken: ?Start Time: 15:12 ?T Area Debrided (L x W): ?otal 10 (cm) x 15 (cm) = 150 (cm?) ?Tissue and other material debrided: ?Non-Viable, Eschar, Slough, Skin: Epidermis, [place] ?Level: Skin/Epidermis ?Debridement Description: Selective/Open Wound ?Instrument: Curette ?Bleeding: Minimum ?Hemostasis Achieved: Pressure ?End Time: 15:29 ?Procedural Pain: 0 ?Post Procedural Pain: 0 ?Response to Treatment: Procedure was tolerated well ?Level of Consciousness (Post-  Awake and Alert ?procedure): ?Post Debridement Measurements of Total Wound ?Length: (cm) 13 ?Width: (cm) 25.6 ?Depth: (cm) 0.1 ?Volume: (cm?) 26.138 ?Character of Wound/Ulcer Post Debridement: Requires Further Debridement ?Severity of Tissue Post Debridement: Fat layer exposed ?Post Procedure Diagnosis ?Same as Pre-procedure ?Electronic Signature(s) ?Signed: 12/28/2021 5:22:08 PM By: Cathy Maudlin MD FACS ?Signed: 12/28/2021 7:00:57 PM By: Cathy Hurst RN, BSN ?Entered By: Cathy [name] on 12/28/2021 15:29:47 ?-------------------------------------------------------------------------------- ?Debridement Details ?Patient Name: ?Date of Service: ?[name], MA RGA RET C. 12/28/2021 1:30 PM ?Medical Record Number: [id number] ?Patient Account Number: [id number] ?Date of Birth/Sex: ?Treating RN: ?[birth year] (81 y.o. Cathy [name] ?Primary Care Provider: Donald [name] ?Other Clinician: ?Referring Provider: ?Treating Provider/Extender: Cathy [name] ?Cathy [name] ?Weeks in Treatment: 0 ?Debridement Performed for Assessment: Wound #2 Left,Lateral,Anterior Lower Leg ?Performed By: Physician Cathy Maudlin, MD ?Debridement Type: Debridement ?Level of Consciousness (Pre-procedure): Awake and Alert ?Pre-procedure Verification/Time Out Yes - 15:12 ?Taken: ?Start Time: 15:29 ?T Area Debrided (L x W): ?otal 4 (cm) x 7 (cm) = 28 (cm?) ?Tissue and other material debrided: ?Non-Viable, Eschar, Slough, Skin: Epidermis, [name] ?Level: Skin/Epidermis ?Debridement Description: Selective/Open Wound ?Instrument: Curette ?Bleeding: Minimum ?Hemostasis Achieved: Pressure ?End Time: 15:32 ?Procedural Pain: 0 ?Post Procedural Pain: 0 ?Response to Treatment: Procedure was tolerated well ?Level of Consciousness (Post- Awake and Alert ?procedure): ?Post Debridement Measurements of Total Wound ?Length: (cm) 4 ?Width: (cm) 7 ?Depth: (cm) 0.1 ?Volume: (cm?) 2.199 ?Character of Wound/Ulcer Post Debridement: Requires Further Debridement ?Post  Procedure Diagnosis ?Same as Pre-procedure ?Electronic Signature(s) ?Signed: 12/28/2021 5:22:08 PM By: Cathy Maudlin MD FACS ?Signed: 12/28/2021 7:00:57 PM By: Cathy Hurst RN, BSN ?Entered By: Cathy [name] on 12/28/2021 15:30:25 ?-------------------------------------------------------------------------------- ?HPI Details ?Patient Name: ?Date of Service: ?[name], MA RGA RET C. 12/28/2021 1:30 PM ?Medical Record Number: [id number] ?Patient Account Number: [id number] ?Date of Birth/Sex: ?Treating RN: ?1941/05/27 (81 y.o. F) ?Primary Care Provider: Donald [name] Other Clinician: ?Referring Provider: ?Treating Provider/Extender: Cathy [name] ?Cathy [name] ?Weeks in Treatment: 0 ?History of Present Illness ?HPI Description: ADMISSION ?12/28/2021 ?This is  a 81 year old woman who has been referred for further evaluation and management of bilateral lower extremity ulcers secondary to uncontrolled ?lymphedema. She is accompanied by her son who helps provide some of the medical history, in addition to review of the electronic medical records. ?Apparently, starting around Thanksgiving, her legs became progressively more strolling and red. They eventually broke open with multiple weeping ulcers. She ?was treated for cellulitis with a series of courses of antibiotics including doxycycline Keflex moxifloxacin (which he did not tolerate) and ciprofloxacin. Despite ?this, her wounds did not improve. Apparently, they were applying hydrogen peroxide to the wounds. She was referred to the lymphedema clinic at [facility]. Due to the open wounds, no significant intervention could be implemented. She was referred to the wound center in order to address ?the open wounds. She has what sounds like a very old pair of juxta lite stockings that she does not use; they are likely too old to be effective anymore at this ?time. She does not wear any other form of compression stocking and she does not have lymphedema  pumps. ?The wounds are on her bilateral lower extremities. The right is nearly circumferential and is covered with heavy crusting and eschar. Some areas are extremely ?dry and others are moist and have significant slough. On the left, the area is much smaller but also has crusted eschar and slough. There is a fairly strong ?odor coming from the wounds. The drainage does not appear purulent. They are not particularly tender. ?Electronic Signature(s) ?Signed: 12/28/2021 5:10:25 PM By: Cathy Maudlin MD FACS ?Entered By: Cathy [name] on 12/28/2021 17:10:25 ?-------------------------------------------------------------------------------- ?Physical Exam Details ?Patient Name: Date of Service: ?[name], MA RGA RET C. 12/28/2021 1:30 PM ?Medical Record Number: [id number] ?Patient Account Number: [id number] ?Date of Birth/Sex: Treating RN: ?1940-11-02 (81 y.o. F) ?Primary Care Provider: Donald [name] Other Clinician: ?Referring Provider: ?Treating Provider/Extender: Cathy [name] ?Cathy [name] ?Weeks in Treatment: 0 ?Constitutional ?. . . . No acute distress. ?Respiratory ?Normal work of breathing on room air.Marland Kitchen ?Cardiovascular ?Easily palpable 2+ dorsalis pedis pulses bilaterally.. 3+ pitting edema to at least the knees bilaterally. Both legs are erythematous.Marland Kitchen ?Notes ?12/28/2021: There is a nearly completely circumferential ulcer on the right lower extremity. It extends from the ankle to the upper calf. Some portions are ?desiccated and others are extremely moist. There is heavy eschar and slough over the entire wound surface. On the left, the area of involvement is limited to ?the ankle and anterior tibial surface. Similar crusting, eschar, and slough are present on this side. There is a strong odor coming from the wounds, although the ?drainage is not purulent. ?Electronic Signature(s) ?Signed: 12/28/2021 5:15:45 PM By: Cathy Maudlin MD FACS ?Entered By: Cathy [name] on 12/28/2021  17:15:45 ?-------------------------------------------------------------------------------- ?Physician Orders Details ?Patient Name: Date of Service: ?[name], MA RGA RET C. 12/28/2021 1:30 PM ?Medical Record Number: [id number] ?Patient Account Numb

## 2021-12-29 ENCOUNTER — Encounter: Payer: Medicare Other | Admitting: Occupational Therapy

## 2021-12-29 NOTE — Progress Notes (Signed)
Fresquez, Ladeja C. ([id number]) ?Visit Report for 12/28/2021 ?Allergy List Details ?Patient Name: Date of Service: ?Cathy LL, Cathy RGA RET C. 12/28/2021 1:30 PM ?Medical Record Number: [id number] ?Patient Account Number: [id number] ?Date of Birth/Sex: Treating RN: ?March 20, 1941 (81 y.o. Cathy [name], Cathy [name] ?Primary Care Kylynn Street: Donald Prose Other Clinician: ?Referring Leslieann Whisman: ?Treating Desarie Feild/Extender: Fredirick Maudlin ?Almedia Balls ?Weeks in Treatment: 0 ?Allergies ?Active Allergies ?Sulfa (Sulfonamide Antibiotics) ?Reaction: hives ?penicillin ?Reaction: blisters ?moxifloxacin ?Reaction: nausea ?Allergy Notes ?Electronic Signature(s) ?Signed: 12/28/2021 7:00:57 PM By: Levan Hurst RN, BSN ?Entered By: Levan Hurst on 12/28/2021 14:41:13 ?-------------------------------------------------------------------------------- ?Arrival Information Details ?Patient Name: Date of Service: ?Cathy LL, Cathy RGA RET C. 12/28/2021 1:30 PM ?Medical Record Number: [id number] ?Patient Account Number: [id number] ?Date of Birth/Sex: Treating RN: ?March 15, 1941 (81 y.o. F) ?Primary Care Juelle Dickmann: Donald Prose Other Clinician: ?Referring Kadan Millstein: ?Treating Asenath Balash/Extender: Fredirick Maudlin ?Almedia Balls ?Weeks in Treatment: 0 ?Visit Information ?Patient Arrived: [name] ?Arrival Time: 14:22 ?Accompanied By: son ?Transfer Assistance: Manual ?Patient Identification Verified: Yes ?Secondary Verification Process Completed: Yes ?Patient Requires Transmission-Based Precautions: No ?Patient Has Alerts: No ?Electronic Signature(s) ?Signed: 12/28/2021 7:00:57 PM By: Levan Hurst RN, BSN ?Entered By: Levan Hurst on 12/28/2021 14:39:43 ?-------------------------------------------------------------------------------- ?Clinic Level of Care Assessment Details ?Patient Name: Date of Service: ?Cathy LL, Cathy RGA RET C. 12/28/2021 1:30 PM ?Medical Record Number: [id number] ?Patient Account Number: [id number] ?Date of Birth/Sex: Treating RN: ?September 15, 1941 (81 y.o. Cathy [name],  Cathy [name] ?Primary Care Joss Friedel: Donald Prose Other Clinician: ?Referring Samara Stankowski: ?Treating Esley Brooking/Extender: Fredirick Maudlin ?Almedia Balls ?Weeks in Treatment: 0 ?Clinic Level of Care Assessment Items ?TOOL 1 Quantity Score ?X- 1 0 ?Use when EandM and Procedure is performed on INITIAL visit ?ASSESSMENTS - Nursing Assessment / Reassessment ?X- 1 20 ?General Physical Exam (combine w/ comprehensive assessment (listed just below) when performed on new pt. evals) ?X- 1 25 ?Comprehensive Assessment (HX, ROS, Risk Assessments, Wounds Hx, etc.) ?ASSESSMENTS - Wound and Skin Assessment / Reassessment ?_0  - 0 ?Dermatologic / Skin Assessment (not related to wound area) ?ASSESSMENTS - Ostomy and/or Continence Assessment and Care ?_1  - 0 ?Incontinence Assessment and Management ?_2  - 0 ?Ostomy Care Assessment and Management (repouching, etc.) ?PROCESS - Coordination of Care ?X - Simple Patient / Family Education for ongoing care 1 15 ?_3  - 0 ?Complex (extensive) Patient / Family Education for ongoing care ?X- 1 10 ?Staff obtains Consents, Records, T Results / Process Orders ?est ?_4  - 0 ?Staff telephones [place], Nursing Homes / Clarify orders / etc ?_5  - 0 ?Routine Transfer to another Facility (non-emergent condition) ?_6  - 0 ?Routine Hospital Admission (non-emergent condition) ?X- 1 15 ?New Admissions / Biomedical engineer / Ordering NPWT Apligraf, etc. ?, ?_7  - 0 ?Emergency Hospital Admission (emergent condition) ?PROCESS - Special Needs ?_8  - 0 ?Pediatric / Minor Patient Management ?_9  - 0 ?Isolation Patient Management ?_10  - 0 ?Hearing / Language / Visual special needs ?_11  - 0 ?Assessment of Community assistance (transportation, D/C planning, etc.) ?_12  - 0 ?Additional assistance / Altered mentation ?_13  - 0 ?Support Surface(s) Assessment (bed, cushion, seat, etc.) ?INTERVENTIONS - Miscellaneous ?_14  - 0 ?External ear exam ?_15  - 0 ?Patient Transfer (multiple staff / Civil Service fast streamer / Similar devices) ?_16  - 0 ?Simple Staple /  Suture removal (25 or less) ?_17  - 0 ?Complex Staple / Suture removal (26 or more) ?_18  - 0 ?Hypo/Hyperglycemic Management (do not check if billed separately) ?X- 1 15 ?Ankle / Brachial Index (ABI) - do not check if billed separately ?Has the patient been seen at the hospital within the last  three years: Yes ?Total Score: 100 ?Level Of Care: New/Established - Level 3 ?Electronic Signature(s) ?Signed: 12/28/2021 7:00:57 PM By: Levan Hurst RN, BSN ?Signed: 12/28/2021 7:00:57 PM By: Levan Hurst RN, BSN ?Entered By: Levan Hurst on 12/28/2021 15:19:05 ?-------------------------------------------------------------------------------- ?Compression Therapy Details ?Patient Name: ?Date of Service: ?Cathy LL, Cathy RGA RET C. 12/28/2021 1:30 PM ?Medical Record Number: [id number] ?Patient Account Number: [id number] ?Date of Birth/Sex: ?Treating RN: ?Jan 08, 1941 (81 y.o. Cathy [name], Cathy [name] ?Primary Care Colt Martelle: Donald Prose ?Other Clinician: ?Referring Matheus Spiker: ?Treating Tambi Thole/Extender: Fredirick Maudlin ?Almedia Balls ?Weeks in Treatment: 0 ?Compression Therapy Performed for Wound Assessment: Wound #1 Right,Circumferential Lower Leg ?Performed By: Clinician Levan Hurst, RN ?Compression Type: Four Layer ?Post Procedure Diagnosis ?Same as Pre-procedure ?Electronic Signature(s) ?Signed: 12/28/2021 7:00:57 PM By: Levan Hurst RN, BSN ?Entered By: Levan Hurst on 12/28/2021 15:30:51 ?-------------------------------------------------------------------------------- ?Compression Therapy Details ?Patient Name: ?Date of Service: ?Cathy LL, Cathy RGA RET C. 12/28/2021 1:30 PM ?Medical Record Number: [id number] ?Patient Account Number: [id number] ?Date of Birth/Sex: ?Treating RN: ?06-07-1941 (81 y.o. Cathy [name], Cathy [name] ?Primary Care Zuriyah Shatz: Donald Prose ?Other Clinician: ?Referring Janeece Blok: ?Treating Anaiah Mcmannis/Extender: Fredirick Maudlin ?Almedia Balls ?Weeks in Treatment: 0 ?Compression Therapy Performed for Wound Assessment: Wound #2  Left,Lateral,Anterior Lower Leg ?Performed By: Clinician Levan Hurst, RN ?Compression Type: Four Layer ?Post Procedure Diagnosis ?Same as Pre-procedure ?Electronic Signature(s) ?Signed: 12/28/2021 7:00:57 PM By: Levan Hurst RN, BSN ?Entered By: Levan Hurst on 12/28/2021 15:30:51 ?-------------------------------------------------------------------------------- ?Encounter Discharge Information Details ?Patient Name: ?Date of Service: ?Cathy LL, Cathy RGA RET C. 12/28/2021 1:30 PM ?Medical Record Number: [id number] ?Patient Account Number: [id number] ?Date of Birth/Sex: ?Treating RN: ?1940/12/25 (81 y.o. Cathy [name], Cathy [name] ?Primary Care Sesar Madewell: Donald Prose ?Other Clinician: ?Referring Kyung Muto: ?Treating Odarius Dines/Extender: Fredirick Maudlin ?Almedia Balls ?Weeks in Treatment: 0 ?Encounter Discharge Information Items Post Procedure Vitals ?Discharge Condition: Stable ?Temperature (F): 98.1 ?Ambulatory Status: [name] ?Pulse (bpm): 72 ?Discharge Destination: Home ?Respiratory Rate (breaths/min): 18 ?Transportation: Private Auto ?Blood Pressure (mmHg): 113/67 ?Accompanied By: son ?Schedule Follow-up Appointment: Yes ?Clinical Summary of Care: Patient Declined ?Electronic Signature(s) ?Signed: 12/28/2021 7:00:57 PM By: Levan Hurst RN, BSN ?Entered By: Levan Hurst on 12/28/2021 17:45:28 ?-------------------------------------------------------------------------------- ?Lower Extremity Assessment Details ?Patient Name: ?Date of Service: ?Cathy LL, Cathy RGA RET C. 12/28/2021 1:30 PM ?Medical Record Number: [id number] ?Patient Account Number: [id number] ?Date of Birth/Sex: ?Treating RN: ?June 05, 1941 (81 y.o. Cathy [name], Cathy [name] ?Primary Care Audric Venn: Donald Prose ?Other Clinician: ?Referring Avalon Coppinger: ?Treating Takira Sherrin/Extender: Fredirick Maudlin ?Almedia Balls ?Weeks in Treatment: 0 ?Edema Assessment ?Assessed: [Left: No] [Right: No] ?Edema: [Left: Yes] [Right: Yes] ?Calf ?Left: Right: ?Point of Measurement: 26 cm From Medial Instep  36 cm 40 cm ?Ankle ?Left: Right: ?Point of Measurement: 10 cm From Medial Instep 24.3 cm 25 cm ?Knee To Floor ?Left: Right: ?From Medial Instep 34 cm 34 cm ?Vascular Assessment ?Pulses: ?Dorsalis Pedis ?Palpable: [

## 2022-01-02 ENCOUNTER — Encounter: Payer: Medicare Other | Admitting: Occupational Therapy

## 2022-01-03 ENCOUNTER — Encounter: Payer: Medicare Other | Admitting: Occupational Therapy

## 2022-01-04 ENCOUNTER — Encounter (HOSPITAL_BASED_OUTPATIENT_CLINIC_OR_DEPARTMENT_OTHER): Payer: Medicare Other | Admitting: General Surgery

## 2022-01-04 DIAGNOSIS — L97212 Non-pressure chronic ulcer of right calf with fat layer exposed: Secondary | ICD-10-CM | POA: Diagnosis not present

## 2022-01-04 NOTE — Progress Notes (Signed)
Cathy [name], Cathy [name]. ([id number]) ?Visit Report for 01/04/2022 ?Arrival Information Details ?Patient Name: Date of Service: ?Cathy [name], Cathy [name]. 01/04/2022 2:30 PM ?Medical Record Number: [id number] ?Patient Account Number: [id number] ?Date of Birth/Sex: Treating RN: ?1940/10/18 (81 y.o. F) ?Primary Care Provider: Donald Prose Other Clinician: ?Referring Provider: ?Treating Provider/Extender: Fredirick Maudlin ?Donald Prose ?Weeks in Treatment: 1 ?Visit Information History Since Last Visit ?Added or deleted any medications: No ?Patient Arrived: Cathy [name] ?Any new allergies or adverse reactions: No ?Arrival Time: 14:32 ?Had a fall or experienced change in No ?Accompanied By: son ?activities of daily living that may affect ?Transfer Assistance: Manual ?risk of falls: ?Patient Identification Verified: Yes ?Signs or symptoms of abuse/neglect since last visito No ?Secondary Verification Process Completed: Yes ?Hospitalized since last visit: No ?Patient Requires Transmission-Based Precautions: No ?Implantable device outside of the clinic excluding No ?Patient Has Alerts: No ?cellular tissue based products placed in the center ?since last visit: ?Has Dressing in Place as Prescribed: Yes ?Has Compression in Place as Prescribed: Yes ?Pain Present Now: No ?Electronic Signature(s) ?Signed: 01/04/2022 4:07:42 PM By: Adline Peals ?Entered By: Adline Peals on 01/04/2022 14:38:02 ?-------------------------------------------------------------------------------- ?Compression Therapy Details ?Patient Name: Date of Service: ?Cathy [name], Cathy [name]. 01/04/2022 2:30 PM ?Medical Record Number: [id number] ?Patient Account Number: [id number] ?Date of Birth/Sex: Treating RN: ?[birth year] (81 y.o. F) Cathy [name], Cathy [name] ?Primary Care Provider: Donald Prose Other Clinician: ?Referring Provider: ?Treating Provider/Extender: Fredirick Maudlin ?Donald Prose ?Weeks in Treatment: 1 ?Compression Therapy Performed for Wound Assessment: Wound #1  Right,Circumferential Lower Leg ?Performed By: Clinician Dellie Catholic, RN ?Compression Type: Four Layer ?Post Procedure Diagnosis ?Same as Pre-procedure ?Electronic Signature(s) ?Signed: 01/04/2022 5:47:48 PM By: Dellie Catholic RN ?Entered By: Dellie Catholic on 01/04/2022 15:47:02 ?-------------------------------------------------------------------------------- ?Compression Therapy Details ?Patient Name: ?Date of Service: ?Cathy [name], Cathy [name]. 01/04/2022 2:30 PM ?Medical Record Number: [id number] ?Patient Account Number: [id number] ?Date of Birth/Sex: ?Treating RN: ?1941/06/06 (81 y.o. F) Cathy [name], Cathy [name] ?Primary Care Provider: Donald Prose ?Other Clinician: ?Referring Provider: ?Treating Provider/Extender: Fredirick Maudlin ?Donald Prose ?Weeks in Treatment: 1 ?Compression Therapy Performed for Wound Assessment: Wound #2 Left,Lateral,Anterior Lower Leg ?Performed By: Clinician Dellie Catholic, RN ?Compression Type: Four Layer ?Post Procedure Diagnosis ?Same as Pre-procedure ?Electronic Signature(s) ?Signed: 01/04/2022 5:47:48 PM By: Dellie Catholic RN ?Entered By: Dellie Catholic on 01/04/2022 15:47:02 ?-------------------------------------------------------------------------------- ?Encounter Discharge Information Details ?Patient Name: ?Date of Service: ?Cathy [name], Cathy [name]. 01/04/2022 2:30 PM ?Medical Record Number: [id number] ?Patient Account Number: [id number] ?Date of Birth/Sex: ?Treating RN: ?04-25-1941 (81 y.o. F) Cathy [name], Cathy [name] ?Primary Care Provider: Donald Prose ?Other Clinician: ?Referring Provider: ?Treating Provider/Extender: Fredirick Maudlin ?Donald Prose ?Weeks in Treatment: 1 ?Encounter Discharge Information Items Post Procedure Vitals ?Discharge Condition: Stable ?Temperature (F): 98.6 ?Ambulatory Status: Wheelchair ?Pulse (bpm): 105 ?Discharge Destination: Home ?Respiratory Rate (breaths/min): 16 ?Transportation: Private Auto ?Blood Pressure (mmHg): 115/72 ?Accompanied By: son ?Schedule Follow-up  Appointment: Yes ?Clinical Summary of Care: Patient Declined ?Electronic Signature(s) ?Signed: 01/04/2022 5:47:48 PM By: Dellie Catholic RN ?Entered By: Dellie Catholic on 01/04/2022 17:47:24 ?-------------------------------------------------------------------------------- ?Lower Extremity Assessment Details ?Patient Name: ?Date of Service: ?Cathy [name], Cathy [name]. 01/04/2022 2:30 PM ?Medical Record Number: [id number] ?Patient Account Number: [id number] ?Date of Birth/Sex: ?Treating RN: ?14-Sep-1941 (81 y.o. F) ?Primary Care Provider: Donald Prose ?Other Clinician: ?Referring Provider: ?Treating Provider/Extender: Fredirick Maudlin ?Donald Prose ?Weeks in Treatment: 1 ?Edema Assessment ?Assessed: [Left: No] [Right: No] ?Edema: [Left: Yes] [Right: Yes] ?Calf ?Left: Right: ?Point of Measurement: 26 cm From Medial Instep 32.6 cm 36.3 cm ?Ankle ?  Left: Right: ?Point of Measurement: 10 cm From Medial Instep 24.6 cm 25.5 cm ?Vascular Assessment ?Pulses: ?Dorsalis Pedis ?Palpable: [Left:Yes] [Right:Yes] ?Electronic Signature(s) ?Signed: 01/04/2022 4:07:42 PM By: Adline Peals ?Entered By: Adline Peals on 01/04/2022 14:48:13 ?-------------------------------------------------------------------------------- ?Multi Wound Chart Details ?Patient Name: ?Date of Service: ?Cathy [name], Cathy [name]. 01/04/2022 2:30 PM ?Medical Record Number: [id number] ?Patient Account Number: [id number] ?Date of Birth/Sex: ?Treating RN: ?03-26-1941 (81 y.o. F) ?Primary Care Provider: Donald Prose ?Other Clinician: ?Referring Provider: ?Treating Provider/Extender: Fredirick Maudlin ?Donald Prose ?Weeks in Treatment: 1 ?Vital Signs ?Height(in): 58 ?Pulse(bpm): 105 ?Weight(lbs): 138 ?Blood Pressure(mmHg): 115/72 ?Body Mass Index(BMI): 28.8 ?Temperature(??F): 98.6 ?Respiratory Rate(breaths/min): 18 ?Photos: [N/A:N/A] ?Right, Circumferential Lower Leg Left, Lateral, Anterior Lower Leg N/A ?Wound Location: ?Gradually Appeared Gradually Appeared N/A ?Wounding  Event: ?Venous Leg Ulcer Lymphedema N/A ?Primary Etiology: ?Lymphedema, Congestive Heart Lymphedema, Congestive Heart N/A ?Comorbid History: ?Failure, Hypertension, Osteoarthritis Failure, Hypertension, Osteoarthritis ?08/21/2021 08/20/2021 N/A ?Date Acquired: ?1 1 N/A ?Weeks of Treatment: ?Open Open N/A ?Wound Status: ?No No N/A ?Wound Recurrence: ?7x19x0.1 0.4x0.2x0.1 N/A ?Measurements L x W x D (cm) ?104.458 0.063 N/A ?A (cm?) : ?rea ?10.446 0.006 N/A ?Volume (cm?) : ?60.00% 99.70% N/A ?% Reduction in Area: ?60.00% 99.70% N/A ?% Reduction in Volume: ?Full Thickness Without Exposed Full Thickness Without Exposed N/A ?Classification: ?Support Structures Support Structures ?Large Medium N/A ?Exudate Amount: ?Serous Serous N/A ?Exudate Type: ?Physiological scientist N/A ?Exudate Color: ?Flat and Intact Flat and Intact N/A ?Wound Margin: ?Medium (34-66%) Large (67-100%) N/A ?Granulation Amount: ?Red, Pink Pink, Pale N/A ?Granulation Quality: ?Medium (34-66%) None Present (0%) N/A ?Necrotic Amount: ?Fat Layer (Subcutaneous Tissue): Yes Fat Layer (Subcutaneous Tissue): Yes N/A ?Exposed Structures: ?Fascia: No ?Fascia: No ?Tendon: No ?Tendon: No ?Muscle: No ?Muscle: No ?Joint: No ?Joint: No ?Bone: No ?Bone: No ?Small (1-33%) Large (67-100%) N/A ?Epithelialization: ?Debridement - Excisional N/A N/A ?Debridement: ?Pre-procedure Verification/Time Out 15:18 N/A N/A ?Taken: ?Lidocaine 4% Topical Solution N/A N/A ?Pain Control: ?Subcutaneous, Slough N/A N/A ?Tissue Debrided: ?Skin/Subcutaneous Tissue N/A N/A ?Level: ?133 N/A N/A ?Debridement A (sq cm): ?rea ?Curette N/A N/A ?Instrument: ?Minimum N/A N/A ?Bleeding: ?Pressure N/A N/A ?Hemostasis A chieved: ?0 N/A N/A ?Procedural Pain: ?0 N/A N/A ?Post Procedural Pain: ?Procedure was tolerated well N/A N/A ?Debridement Treatment Response: ?7x19x0.1 N/A N/A ?Post Debridement Measurements L x ?W x D (cm) ?10.446 N/A N/A ?Post Debridement Volume: (cm?) ?Debridement N/A N/A ?Procedures  Performed: ?Treatment Notes ?Electronic Signature(s) ?Signed: 01/04/2022 3:28:52 PM By: Fredirick Maudlin MD FACS ?Entered By: Fredirick Maudlin on 01/04/2022 15:28:52 ?---------------------------------------------------------------------

## 2022-01-04 NOTE — Progress Notes (Signed)
Brougham, Carron C. ([id number]) ?Visit Report for 01/04/2022 ?Chief Complaint Document Details ?Patient Name: Date of Service: ?[name], MA RGA RET C. 01/04/2022 2:30 PM ?Medical Record Number: [id number] ?Patient Account Number: [id number] ?Date of Birth/Sex: Treating RN: ?[birth year] (81 y.o. F) ?Primary Care Provider: Donald Prose Other Clinician: ?Referring Provider: ?Treating Provider/Extender: Fredirick Maudlin ?Donald Prose ?Weeks in Treatment: 1 ?Information Obtained from: Patient ?Chief Complaint ?Patient seen for complaints of Non-Healing Wounds. ?Electronic Signature(s) ?Signed: 01/04/2022 3:29:05 PM By: Fredirick Maudlin MD FACS ?Entered By: Fredirick Maudlin on 01/04/2022 15:29:05 ?-------------------------------------------------------------------------------- ?Debridement Details ?Patient Name: Date of Service: ?[name], MA RGA RET C. 01/04/2022 2:30 PM ?Medical Record Number: [id number] ?Patient Account Number: [id number] ?Date of Birth/Sex: Treating RN: ?01/28/1941 (81 y.o. F) [name], [name] ?Primary Care Provider: Donald Prose Other Clinician: ?Referring Provider: ?Treating Provider/Extender: Fredirick Maudlin ?Donald Prose ?Weeks in Treatment: 1 ?Debridement Performed for Assessment: Wound #1 Right,Circumferential Lower Leg ?Performed By: Physician Fredirick Maudlin, MD ?Debridement Type: Debridement ?Severity of Tissue Pre Debridement: Fat layer exposed ?Level of Consciousness (Pre-procedure): Awake and Alert ?Pre-procedure Verification/Time Out Yes - 15:18 ?Taken: ?Start Time: 15:18 ?Pain Control: Lidocaine 4% T opical Solution ?T Area Debrided (L x W): ?otal 7 (cm) x 19 (cm) = 133 (cm?) ?Tissue and other material debrided: Non-Viable, Slough, Subcutaneous, [place] ?Level: Skin/Subcutaneous Tissue ?Debridement Description: Excisional ?Instrument: Curette ?Bleeding: Minimum ?Hemostasis Achieved: Pressure ?End Time: 15:20 ?Procedural Pain: 0 ?Post Procedural Pain: 0 ?Response to Treatment: Procedure was tolerated  well ?Level of Consciousness (Post- Awake and Alert ?procedure): ?Post Debridement Measurements of Total Wound ?Length: (cm) 7 ?Width: (cm) 19 ?Depth: (cm) 0.1 ?Volume: (cm?) 10.446 ?Character of Wound/Ulcer Post Debridement: Improved ?Severity of Tissue Post Debridement: Fat layer exposed ?Post Procedure Diagnosis ?Same as Pre-procedure ?Electronic Signature(s) ?Signed: 01/04/2022 4:03:20 PM By: Fredirick Maudlin MD FACS ?Signed: 01/04/2022 5:47:48 PM By: Dellie Catholic RN ?Entered By: Dellie Catholic on 01/04/2022 15:23:01 ?-------------------------------------------------------------------------------- ?HPI Details ?Patient Name: Date of Service: ?[name], MA RGA RET C. 01/04/2022 2:30 PM ?Medical Record Number: [id number] ?Patient Account Number: [id number] ?Date of Birth/Sex: Treating RN: ?1941/04/14 (81 y.o. 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 She is accompanied by her son who helps provide some of the medical history, in addition to review of the electronic medical records. ?Apparently, starting around Thanksgiving, her legs became progressively more strolling and red. They eventually broke open with multiple weeping ulcers. She ?was treated for cellulitis with a series of courses of antibiotics including doxycycline Keflex moxifloxacin (which he did not tolerate) and ciprofloxacin. Despite ?this, her wounds did not improve. Apparently, they were applying hydrogen peroxide to the wounds. She was referred to the lymphedema clinic at [facility]. Due to the open wounds, no significant intervention could be implemented. She was referred to the wound  center in order to address ?the open wounds. She has what sounds like a very old pair of juxta lite stockings that she does not use; they are likely too old to be effective anymore at this ?time. She does not wear any other form of compression stocking and she does not have lymphedema pumps. ?The wounds are on her bilateral lower extremities. The right is nearly circumferential and is covered with heavy crusting and eschar. Some areas are  extremely ?dry and others are moist and have significant slough. On the left, the area is much smaller but also has crusted eschar and slough. There is a fairly strong ?odor coming from the wounds. The drainage does not appear purulent. They are not particularly tender. ?01/04/2022: T oday, her wounds are much improved. The right sided wound has partially healed, separating the previously circumferential wound into anterior and ?posterior sites. There is significantly less slough and the moisture balance is more appropriate. The wound on the left is nearly closed with just a punctate ?opening. It is clean without any significant drainage. There is no odor. ?Electronic Signature(s) ?Signed: 01/04/2022 3:30:39 PM By: Fredirick Maudlin MD FACS ?Entered By: Fredirick Maudlin on 01/04/2022 15:30:39 ?-------------------------------------------------------------------------------- ?Physical Exam Details ?Patient Name: Date of Service: ?[name], MA RGA RET C. 01/04/2022 2:30 PM ?Medical Record Number: [id number] ?Patient Account Number: [id number] ?Date of Birth/Sex: Treating RN: ?[birth year] (81 y.o. F) ?Primary Care Provider: Donald Prose Other Clinician: ?Referring Provider: ?Treating Provider/Extender: Fredirick Maudlin ?Donald Prose ?Weeks in Treatment: 1 ?Constitutional ?. Slightly tachycardic, asymptomatic.. . No acute distress. ?Respiratory ?Normal work of breathing on room air. ?Notes ?01/04/2022: The right sided ulcer has partially healed, dividing the previously nearly circumferential lesion  into 2 separate areas. Both are clean with markedly ?less slough. Moisture balance is much better. Edema control is excellent. On the left, the wound has nearly healed and is just a punctate opening. No odor. No ?concern for infection. ?Electronic Signature(s) ?Signed: 01/04/2022 3:32:40 PM By: Fredirick Maudlin MD FACS ?Entered By: Fredirick Maudlin on 01/04/2022 15:32:40 ?-------------------------------------------------------------------------------- ?Physician Orders Details ?Patient Name: Date of Service: ?[name], MA RGA RET C. 01/04/2022 2:30 PM ?Medical Record Number: [id number] ?Patient Account Number: [id number] ?Date of Birth/Sex: Treating RN: ?11/20/1940 (81 y.o. 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 - Dr. Celine Ahr - Room 2 ?Return A ?Bathing/ Shower/ Hygiene ?May shower with protection but do not get wound dressing(s) wet. - Ok to use Biomedical engineer, can buy at [facility], [place], or [place] ?Edema Control - Lymphedema / SCD / Other ?Elevate legs to the level of the heart or above for 30 minutes daily and/or when sitting, a frequency of: - throughout the day ?Avoid standing for long periods of time. ?Exercise regularly ?Wound Treatment ?Wound #1 - Lower Leg Wound Laterality: Right, Circumferential ?Cleanser: Soap and Water 1 x Per Week ?Discharge Instructions: May shower and wash wound with dial antibacterial soap and water prior to dressing change. ?Cleanser: Wound Cleanser 1 x Per Week ?Discharge Instructions:  Cleanse the wound with wound cleanser prior to applying a clean dressing using gauze sponges, not tissue or cotton balls. ?Peri-Wound Care: Triamcinolone 15 (g) 1 x Per Week ?Discharge Instructions: Use tr

## 2022-01-05 ENCOUNTER — Ambulatory Visit (INDEPENDENT_AMBULATORY_CARE_PROVIDER_SITE_OTHER): Payer: Medicare Other | Admitting: Gastroenterology

## 2022-01-05 ENCOUNTER — Encounter: Payer: Self-pay | Admitting: Gastroenterology

## 2022-01-05 ENCOUNTER — Encounter: Payer: Medicare Other | Admitting: Occupational Therapy

## 2022-01-05 VITALS — BP 116/68 | HR 77 | Ht <= 58 in | Wt 131.4 lb

## 2022-01-05 DIAGNOSIS — R195 Other fecal abnormalities: Secondary | ICD-10-CM | POA: Diagnosis not present

## 2022-01-05 DIAGNOSIS — D649 Anemia, unspecified: Secondary | ICD-10-CM | POA: Diagnosis not present

## 2022-01-05 NOTE — Progress Notes (Signed)
? ? ? ?01/05/2022 ?Cathy [name] ?[id number] ?[birth year] ? ? ?HISTORY OF PRESENT ILLNESS: This is a 81 year old female who is new to our office.  She has been referred here by Carilyn Goodpasture, NP, for evaluation regarding Hemoccult positive stools.  The patient's hemoglobin has trended down about 1 to 1.5 g over the past several months.  Most recently was 10.5 grams in February.  Hgb 11.9 grams in June 2022.  Her iron studies were normal.  She had 3 Hemoccults performed and those are all positive.  The patient denies any black or bloody stools.  She has a bowel movement regularly.  No GI complaints.  She has never had a colonoscopy in the past.  She tells me that she attempted to have a colonoscopy several years ago, but had issues with the large amount of prep that she had to drink.  She did have a Cologuard performed, but they think the last was probably at least 3 years or so years ago. ? ? ?Past Medical History:  ?Diagnosis Date  ? Hypertension   ? ?Past Surgical History:  ?Procedure Laterality Date  ? NO PAST SURGERIES    ? ? reports that she has never smoked. She has never used smokeless tobacco. She reports current alcohol use of about 2.0 standard drinks per week. She reports that she does not use drugs. ?family history is not on file. ?Allergies  ?Allergen Reactions  ? Sulfa Antibiotics Hives  ? Sulfamethoxazole-Trimethoprim Rash  ? ? ?  ?Outpatient Encounter Medications as of 01/05/2022  ?Medication Sig  ? acetaminophen (TYLENOL) 325 MG tablet Take 2 tablets (650 mg total) by mouth every 6 (six) hours as needed for mild pain (or Fever >/= 101).  ? KAPSPARGO SPRINKLE 25 MG CS24 Take 1 capsule by mouth daily.  ? [DISCONTINUED] furosemide (LASIX) 20 MG tablet Take 20 mg by mouth every other day.  ? [DISCONTINUED] lisinopril (ZESTRIL) 20 MG tablet Take 1 tablet by mouth daily.  ? [DISCONTINUED] potassium chloride (KLOR-CON) 10 MEQ tablet Take 1 tablet (10 mEq total) by mouth every other day. Take along with  lasix  ? ?No facility-administered encounter medications on file as of 01/05/2022.  ? ? ?REVIEW OF SYSTEMS  : All other systems reviewed and negative except where noted in the History of Present Illness. ? ? ?PHYSICAL EXAM: ?BP 116/68   Pulse 77   Ht 4\' 5"  (1.346 m)   Wt 131 lb 6.4 oz (59.6 kg)   SpO2 96%   BMI 32.89 kg/m?  ?General: Well developed white female in no acute distress ?Head: Normocephalic and atraumatic ?Eyes:  Sclerae anicteric, conjunctiva pink. ?Ears: Normal auditory acuity ?Lungs: Clear throughout to auscultation; no W/R/R. ?Heart: Regular rate and rhythm; no M/R/G. ?Abdomen: Soft, non-distended.  BS present. Non-tender. ?Rectal:  Will be done at the time of colonoscopy. ?Musculoskeletal: Symmetrical with no gross deformities  ?Skin: No lesions on visible extremities ?Extremities: No edema  ?Neurological: Alert oriented x 4, grossly non-focal ?Psychological:  Alert and cooperative. Normal mood and affect ? ?ASSESSMENT AND PLAN: ?*81 year old female with about a 1 to 1.5 g drop in hemoglobin over the past several months.  Hemoccult positive x3.  Denies any signs of overt GI bleeding.  No other GI complaints.  Has never had a colonoscopy in the past.  We will plan for both EGD and colonoscopy.  Her son is with her today and he needs to look at the schedule and try to coordinate help for her while doing  the prep, etc.  He will call back to schedule those procedures with Dr. Meridee Score. ? ? ?CC:  [place] [place] [facility] ?CC:  Carilyn Goodpasture, NP ? ?  ?

## 2022-01-05 NOTE — Patient Instructions (Signed)
Call back to schedule procedure at [phone].  ? ?If you are age 79 or older, your body mass index should be between 23-30. Your Body mass index is 32.89 kg/m?Marland Kitchen If this is out of the aforementioned range listed, please consider follow up with your Primary Care Provider. ?________________________________________________________ ? ?The [place] [facility] providers would like to encourage you to use [facility] to communicate with providers for non-urgent requests or questions.  Due to long hold times on the telephone, sending your provider a message by [facility] may be a faster and more efficient way to get a response.  Please allow 48 business hours for a response.  Please remember that this is for non-urgent requests.  ?_______________________________________________________ ? ?

## 2022-01-06 ENCOUNTER — Encounter: Payer: Medicare Other | Admitting: Occupational Therapy

## 2022-01-08 NOTE — Progress Notes (Signed)
Attending Physician's Attestation   I have reviewed the chart.   I agree with the Advanced Practitioner's note, impression, and recommendations with any updates as below.    Meriah Shands Mansouraty, MD [facility] Advanced [facility]oscopy Office # [phone]  

## 2022-01-09 ENCOUNTER — Encounter: Payer: Medicare Other | Admitting: Occupational Therapy

## 2022-01-10 ENCOUNTER — Encounter: Payer: Medicare Other | Admitting: Occupational Therapy

## 2022-01-11 ENCOUNTER — Encounter (HOSPITAL_BASED_OUTPATIENT_CLINIC_OR_DEPARTMENT_OTHER): Payer: Medicare Other | Admitting: General Surgery

## 2022-01-12 ENCOUNTER — Encounter: Payer: Medicare Other | Admitting: Occupational Therapy

## 2022-01-13 ENCOUNTER — Encounter (HOSPITAL_BASED_OUTPATIENT_CLINIC_OR_DEPARTMENT_OTHER): Payer: Medicare Other | Admitting: General Surgery

## 2022-01-13 ENCOUNTER — Encounter: Payer: Medicare Other | Admitting: Occupational Therapy

## 2022-01-13 DIAGNOSIS — L97212 Non-pressure chronic ulcer of right calf with fat layer exposed: Secondary | ICD-10-CM | POA: Diagnosis not present

## 2022-01-13 NOTE — Progress Notes (Signed)
[name], Cathy C. ([id number]) ?Visit Report for 01/13/2022 ?Arrival Information Details ?Patient Name: Date of Service: ?[name], MA RGA RET C. 01/13/2022 10:00 A M ?Medical Record Number: [id number] ?Patient Account Number: [id number] ?Date of Birth/Sex: Treating RN: ?01-11-1941 (81 y.o. F) [name], Cathy ?Primary Care Takisha Pelle: Donald Prose Other Clinician: ?Referring Felis Quillin: ?Treating Yitzchok Carriger/Extender: Fredirick Maudlin ?Donald Prose ?Weeks in Treatment: 2 ?Visit Information History Since Last Visit ?Added or deleted any medications: No ?Patient Arrived: [name] ?Any new allergies or adverse reactions: No ?Arrival Time: 10:13 ?Had a fall or experienced change in No ?Accompanied By: caregiver ?activities of daily living that may affect ?Transfer Assistance: None ?risk of falls: ?Patient Identification Verified: Yes ?Signs or symptoms of abuse/neglect since last visito No ?Secondary Verification Process Completed: Yes ?Hospitalized since last visit: No ?Patient Requires Transmission-Based Precautions: No ?Implantable device outside of the clinic excluding No ?Patient Has Alerts: No ?cellular tissue based products placed in the center ?since last visit: ?Has Dressing in Place as Prescribed: Yes ?Has Compression in Place as Prescribed: Yes ?Pain Present Now: No ?Electronic Signature(s) ?Signed: 01/13/2022 6:05:16 PM By: Baruch Gouty RN, BSN ?Entered By: Baruch Gouty on 01/13/2022 10:14:24 ?-------------------------------------------------------------------------------- ?Compression Therapy Details ?Patient Name: Date of Service: ?[name], MA RGA RET C. 01/13/2022 10:00 A M ?Medical Record Number: [id number] ?Patient Account Number: [id number] ?Date of Birth/Sex: Treating RN: ?Feb 04, 1941 (81 y.o. Cathy [name], Cathy ?Primary Care Silverio Hagan: Donald Prose Other Clinician: ?Referring Ata Pecha: ?Treating Zimal Weisensel/Extender: Fredirick Maudlin ?Donald Prose ?Weeks in Treatment: 2 ?Compression Therapy Performed for Wound Assessment:  Wound #1 Right,Circumferential Lower Leg ?Performed By: Clinician Levan Hurst, RN ?Compression Type: Three Layer ?Post Procedure Diagnosis ?Same as Pre-procedure ?Electronic Signature(s) ?Signed: 01/13/2022 6:10:38 PM By: Levan Hurst RN, BSN ?Entered By: Levan Hurst on 01/13/2022 10:46:50 ?-------------------------------------------------------------------------------- ?Compression Therapy Details ?Patient Name: ?Date of Service: ?[name], MA RGA RET C. 01/13/2022 10:00 A M ?Medical Record Number: [id number] ?Patient Account Number: [id number] ?Date of Birth/Sex: ?Treating RN: ?1941/03/05 (80 y.o. Cathy [name], Cathy ?Primary Care Boss Danielsen: Donald Prose ?Other Clinician: ?Referring Tonae Livolsi: ?Treating [name]/Extender: Fredirick Maudlin ?Donald Prose ?Weeks in Treatment: 2 ?Compression Therapy Performed for Wound Assessment: Wound #2 Left,Lateral,Anterior Lower Leg ?Performed By: Clinician Levan Hurst, RN ?Compression Type: Three Layer ?Post Procedure Diagnosis ?Same as Pre-procedure ?Electronic Signature(s) ?Signed: 01/13/2022 6:10:38 PM By: Levan Hurst RN, BSN ?Entered By: Levan Hurst on 01/13/2022 10:46:50 ?-------------------------------------------------------------------------------- ?Compression Therapy Details ?Patient Name: ?Date of Service: ?[name], MA RGA RET C. 01/13/2022 10:00 A M ?Medical Record Number: [id number] ?Patient Account Number: [id number] ?Date of Birth/Sex: ?Treating RN: ?[birth year] (81 y.o. Cathy [name], Cathy ?Primary Care [name]: Donald Prose ?Other Clinician: ?Referring Donnamarie Shankles: ?Treating Aslin Farinas/Extender: Fredirick Maudlin ?Donald Prose ?Weeks in Treatment: 2 ?Compression Therapy Performed for Wound Assessment: Wound #3 Right,Posterior Lower Leg ?Performed By: Clinician Levan Hurst, RN ?Compression Type: Three Layer ?Post Procedure Diagnosis ?Same as Pre-procedure ?Electronic Signature(s) ?Signed: 01/13/2022 6:10:38 PM By: Levan Hurst RN, BSN ?Entered By: Levan Hurst on  01/13/2022 10:46:50 ?-------------------------------------------------------------------------------- ?Encounter Discharge Information Details ?Patient Name: ?Date of Service: ?[name], MA RGA RET C. 01/13/2022 10:00 A M ?Medical Record Number: [id number] ?Patient Account Number: [id number] ?Date of Birth/Sex: ?Treating RN: ?12/18/1940 (81 y.o. Cathy [name], Cathy ?Primary Care Tyde Lamison: Donald Prose ?Other Clinician: ?Referring Alexyss Balzarini: ?Treating [name]/Extender: Fredirick Maudlin ?Donald Prose ?Weeks in Treatment: 2 ?Encounter Discharge Information Items ?Discharge Condition: Stable ?Ambulatory Status: [name] ?Discharge Destination: Home ?Transportation: Private Auto ?Accompanied By: alone ?Schedule Follow-up Appointment: Yes ?Clinical Summary of Care: Patient Declined ?Electronic Signature(s) ?Signed: 01/13/2022 6:10:38  PM By: Levan Hurst RN, BSN ?Entered By: Levan Hurst on 01/13/2022 17:28:01 ?-------------------------------------------------------------------------------- ?Lower Extremity Assessment Details ?Patient Name: ?Date of Service: ?[name], MA RGA RET C. 01/13/2022 10:00 A M ?Medical Record Number: [id number] ?Patient Account Number: [id number] ?Date of Birth/Sex: ?Treating RN: ?1941/02/10 (81 y.o. F) [name], Cathy ?Primary Care [name]: Donald Prose ?Other Clinician: ?Referring [name]: ?Treating Cleon Signorelli/Extender: Fredirick Maudlin ?Donald Prose ?Weeks in Treatment: 2 ?Edema Assessment ?Assessed: [Left: No] [Right: No] ?Edema: [Left: Yes] [Right: Yes] ?Calf ?Left: Right: ?Point of Measurement: 26 cm From Medial Instep 31 cm 33.5 cm ?Ankle ?Left: Right: ?Point of Measurement: 10 cm From Medial Instep 23 cm 25.5 cm ?Vascular Assessment ?Pulses: ?Dorsalis Pedis ?Palpable: [Left:Yes] [Right:Yes] ?Electronic Signature(s) ?Signed: 01/13/2022 6:05:16 PM By: Baruch Gouty RN, BSN ?Entered By: Baruch Gouty on 01/13/2022  10:28:36 ?-------------------------------------------------------------------------------- ?Multi Wound Chart Details ?Patient Name: ?Date of Service: ?[name], MA RGA RET C. 01/13/2022 10:00 A M ?Medical Record Number: [id number] ?Patient Account Number: [id number] ?Date of Birth/Sex: ?Treating RN: ?01/25/1941 (81 y.o. F) ?Primary Care Gentle Hoge: Donald Prose ?Other Clinician: ?Referring Iness Pangilinan: ?Treating Laird Runnion/Extender: Fredirick Maudlin ?Donald Prose ?Weeks in Treatment: 2 ?Vital Signs ?Height(in): 58 ?Pulse(bpm): 92 ?Weight(lbs): 138 ?Blood Pressure(mmHg): 150/79 ?Body Mass Index(BMI): 28.8 ?Temperature(??F): 98.4 ?Respiratory Rate(breaths/min): 18 ?Photos: ?Right, Circumferential Lower Leg Left, Lateral, Anterior Lower Leg Right, Posterior Lower Leg ?Wound Location: ?Gradually Appeared Gradually Appeared Gradually Appeared ?Wounding Event: ?Venous Leg Ulcer Lymphedema Venous Leg Ulcer ?Primary Etiology: ?Lymphedema, Congestive Heart Lymphedema, Congestive Heart Lymphedema, Congestive Heart ?Comorbid History: ?Failure, Hypertension, Osteoarthritis Failure, Hypertension, Osteoarthritis Failure, Hypertension, Osteoarthritis ?08/21/2021 08/20/2021 01/13/2022 ?Date Acquired: ?2 2 0 ?Weeks of Treatment: ?Open Open Open ?Wound Status: ?No No No ?Wound Recurrence: ?3.7x4.6x0.7 0x0x0 2.7x2.9x0.1 ?Measurements L x W x D (cm) ?13.367 0 6.15 ?A (cm?) : ?rea ?9.357 0 0.615 ?Volume (cm?) : ?94.90% 100.00% 0.00% ?% Reduction in Area: ?64.20% 100.00% 0.00% ?% Reduction in Volume: ?Full Thickness Without Exposed Full Thickness Without Exposed Full Thickness Without Exposed ?Classification: ?Support Structures Support Structures Support Structures ?Medium None Present Medium ?Exudate Amount: ?Serous N/A Serosanguineous ?Exudate Type: ?amber N/A red, brown ?Exudate Color: ?Flat and Intact N/A Flat and Intact ?Wound Margin: ?Large (67-100%) None Present (0%) Medium (34-66%) ?Granulation Amount: ?Red, Pink N/A Pink ?Granulation  Quality: ?Small (1-33%) None Present (0%) Medium (34-66%) ?Necrotic Amount: ?Fat Layer (Subcutaneous Tissue): Yes Fascia: No Fat Layer (Subcutaneous Tissue): Yes ?Exposed Structures: ?Fascia: No ?Fat Layer (Subcutaneous Tissue): No ?Fascia: No ?Tendon: No ?Tendon: No ?Tendon: No ?Muscle: No ?Muscle:

## 2022-01-13 NOTE — Progress Notes (Addendum)
[name], Cathy [name]. ([id number]) ?Visit Report for 01/13/2022 ?Chief Complaint Document Details ?Patient Name: Date of Service: ?[name], Cathy [name]. 01/13/2022 10:00 A M ?Medical Record Number: [id number] ?Patient Account Number: [id number] ?Date of Birth/Sex: Treating RN: ?[birth year] (81 y.o. F) ?Primary Care Provider: Donald Prose Other Clinician: ?Referring Provider: ?Treating Provider/Extender: Fredirick Maudlin ?Donald Prose ?Weeks in Treatment: 2 ?Information Obtained from: Patient ?Chief Complaint ?Patient seen for complaints of Non-Healing Wounds. ?Electronic Signature(s) ?Signed: 01/13/2022 10:48:15 AM By: Fredirick Maudlin MD FACS ?Entered By: Fredirick Maudlin on 01/13/2022 10:48:14 ?-------------------------------------------------------------------------------- ?HPI Details ?Patient Name: Date of Service: ?[name], Cathy [name]. 01/13/2022 10:00 A M ?Medical Record Number: [id number] ?Patient Account Number: [id number] ?Date of Birth/Sex: Treating RN: ?04-03-1941 (81 y.o. F) ?Primary Care Provider: Donald Prose Other Clinician: ?Referring Provider: ?Treating Provider/Extender: Fredirick Maudlin ?Donald Prose ?Weeks in Treatment: 2 ?History of Present Illness ?HPI Description: ADMISSION ?12/28/2021 ?This is a 81 year old woman who has been referred for further evaluation and management of bilateral lower extremity ulcers secondary to uncontrolled ?lymphedema. She is accompanied by her son who helps provide some of the medical history, in addition to review of the electronic medical records. ?Apparently, starting around Thanksgiving, her legs became progressively more strolling and red. They eventually broke open with multiple weeping ulcers. She ?was treated for cellulitis with a series of courses of antibiotics including doxycycline Keflex moxifloxacin (which he did not tolerate) and ciprofloxacin. Despite ?this, her wounds did not improve. Apparently, they were applying hydrogen peroxide to the wounds. She was referred  to the lymphedema clinic at [facility]. Due to the open wounds, no significant intervention could be implemented. She was referred to the wound center in order to address ?the open wounds. She has what sounds like a very old pair of juxta lite stockings that she does not use; they are likely too old to be effective anymore at this ?time. She does not wear any other form of compression stocking and she does not have lymphedema pumps. ?The wounds are on her bilateral lower extremities. The right is nearly circumferential and is covered with heavy crusting and eschar. Some areas are extremely ?dry and others are moist and have significant slough. On the left, the area is much smaller but also has crusted eschar and slough. There is a fairly strong ?odor coming from the wounds. The drainage does not appear purulent. They are not particularly tender. ?01/04/2022: T oday, her wounds are much improved. The right sided wound has partially healed, separating the previously circumferential wound into anterior and ?posterior sites. There is significantly less slough and the moisture balance is more appropriate. The wound on the left is nearly closed with just a punctate ?opening. It is clean without any significant drainage. There is no odor. ?01/13/2022: The right sided wound is much smaller and more epithelialized. No slough buildup and edema control is good. The left-sided wound is closed, but she ?has a new open area just above where her compression wraps were. This appears to be a small blister that has opened. No odor and no significant drainage ?from any of the wounds. ?Electronic Signature(s) ?Signed: 01/13/2022 10:49:15 AM By: Fredirick Maudlin MD FACS ?Entered By: Fredirick Maudlin on 01/13/2022 10:49:14 ?-------------------------------------------------------------------------------- ?Physical Exam Details ?Patient Name: ?Date of Service: ?[name], Cathy [name]. 01/13/2022 10:00 A M ?Medical Record  Number: [id number] ?Patient Account Number: [id number] ?Date of Birth/Sex: ?Treating RN: ?1941/06/19 (81 y.o. F) ?Primary Care Provider: Donald Prose ?Other Clinician: ?Referring Provider: ?  Treating Provider/Extender: Fredirick Maudlin ?Donald Prose ?Weeks in Treatment: 2 ?Constitutional ?She is hypertensive, but asymptomatic.. . . . No acute distress. ?Respiratory ?Normal work of breathing on room air. ?Notes ?01/13/2022: The right sided wound is much smaller and more epithelialized. No slough buildup and edema control is good. The left-sided wound is closed, but she ?has a new open area just above where her compression wraps were. This appears to be a small blister that has opened. No odor and no significant drainage ?from any of the wounds. ?Electronic Signature(s) ?Signed: 01/13/2022 10:50:06 AM By: Fredirick Maudlin MD FACS ?Entered By: Fredirick Maudlin on 01/13/2022 10:50:06 ?-------------------------------------------------------------------------------- ?Physician Orders Details ?Patient Name: ?Date of Service: ?[name], Cathy [name]. 01/13/2022 10:00 A M ?Medical Record Number: [id number] ?Patient Account Number: [id number] ?Date of Birth/Sex: ?Treating RN: ?1941/06/02 (81 y.o. 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 - Dr. Celine Ahr - Wednesday 4/26 at 3:45 - Room 2 ?Return A ?Bathing/ Shower/ Hygiene ?May shower with protection but do not get wound dressing(s) wet. - Ok to use Biomedical engineer, can buy at [facility], [place], or  [place] ?Edema Control - Lymphedema / SCD / Other ?Elevate legs to the level of the heart or above for 30 minutes daily and/or when sitting, a frequency of: - throughout the day ?Avoid standing for long periods of time. ?Exercise regularly ?Wound Treatment ?Wound #1 - Lower Leg Wound Laterality: Right, Circumferential ?Cleanser: Soap and Water 1 x Per Week ?Discharge Instructions: May shower and wash wound with dial antibacterial soap and water prior to dressing change. ?Cleanser: Wound Cleanser 1 x Per Week ?Discharge Instructions: Cleanse the wound with wound cleanser prior to applying a clean dressing using gauze sponges, not tissue or cotton balls. ?Peri-Wound Care: Triamcinolone 15 (g) 1 x Per Week ?Discharge Instructions: Use triamcinolone 15 (g) as directed ?Peri-Wound Care: Sween Lotion (Moisturizing lotion) 1 x Per Week ?Discharge Instructions: Apply moisturizing lotion as directed ?Prim Dressing: KerraCel Ag Gelling Fiber Dressing, 4x5 in (silver alginate) 1 x Per Week ?ary ?Discharge Instructions: Apply silver alginate to wound bed as instructed ?Secondary Dressing: ABD Pad, 8x10 1 x Per Week ?Discharge Instructions: Apply over primary dressing as directed. ?Secondary Dressing: Woven Gauze Sponge, Non-Sterile 4x4 in 1 x Per Week ?Discharge Instructions: Apply over primary dressing as directed. ?Compression Wrap: FourPress (4 layer compression wrap) 1 x Per Week ?Discharge Instructions: Apply four layer compression as directed. May also use Miliken CoFlex 2 layer compression system as ?alternative. ?Wound #2 - Lower Leg Wound Laterality: Left, Lateral, Anterior ?Cleanser: Soap and Water 1 x Per Week ?Discharge Instructions: May shower and wash wound with dial antibacterial soap and water prior to dressing change. ?Cleanser: Wound Cleanser 1 x Per Week ?Discharge Instructions: Cleanse the wound with wound cleanser prior to applying a clean dressing using gauze sponges, not tissue or cotton balls. ?Peri-Wound  Care: Triamcinolone 15 (g) 1 x Per Week ?Discharge Instructions: Use triamcinolone 15 (g) as directed ?Peri-Wound Care: Sween Lotion (Moisturizing lotion) 1 x Per Week ?Discharge Instructions: Apply m

## 2022-01-16 ENCOUNTER — Encounter: Payer: Medicare Other | Admitting: Occupational Therapy

## 2022-01-17 ENCOUNTER — Encounter: Payer: Medicare Other | Admitting: Occupational Therapy

## 2022-01-18 ENCOUNTER — Encounter (HOSPITAL_BASED_OUTPATIENT_CLINIC_OR_DEPARTMENT_OTHER): Payer: Medicare Other | Admitting: General Surgery

## 2022-01-18 DIAGNOSIS — L97212 Non-pressure chronic ulcer of right calf with fat layer exposed: Secondary | ICD-10-CM | POA: Diagnosis not present

## 2022-01-19 ENCOUNTER — Encounter: Payer: Medicare Other | Admitting: Occupational Therapy

## 2022-01-19 NOTE — Progress Notes (Signed)
Berryhill, Priscillia C. ([id number]) ?Visit Report for 01/18/2022 ?Chief Complaint Document Details ?Patient Name: Date of Service: ?[name], MA RGA RET C. 01/18/2022 3:45 PM ?Medical Record Number: [id number] ?Patient Account Number: [id number] ?Date of Birth/Sex: Treating RN: ?05/05/1941 (81 y.o. [name], [name] ?Primary Care Provider: Donald Prose Other Clinician: ?Referring Provider: ?Treating Provider/Extender: Fredirick Maudlin ?Donald Prose ?Weeks in Treatment: 3 ?Information Obtained from: Patient ?Chief Complaint ?Patient seen for complaints of Non-Healing Wounds. ?Electronic Signature(s) ?Signed: 01/18/2022 4:52:48 PM By: Fredirick Maudlin MD FACS ?Entered By: Fredirick Maudlin on 01/18/2022 16:52:48 ?-------------------------------------------------------------------------------- ?Debridement Details ?Patient Name: Date of Service: ?[name], MA RGA RET C. 01/18/2022 3:45 PM ?Medical Record Number: [id number] ?Patient Account Number: [id number] ?Date of Birth/Sex: Treating RN: ?1941-07-19 (81 y.o. [name], [name] ?Primary Care Provider: Donald Prose Other Clinician: ?Referring Provider: ?Treating Provider/Extender: Fredirick Maudlin ?Donald Prose ?Weeks in Treatment: 3 ?Debridement Performed for Assessment: Wound #3 Right,Posterior Lower Leg ?Performed By: Physician Fredirick Maudlin, MD ?Debridement Type: Debridement ?Severity of Tissue Pre Debridement: Fat layer exposed ?Level of Consciousness (Pre-procedure): Awake and Alert ?Pre-procedure Verification/Time Out Yes - 16:15 ?Taken: ?Start Time: 16:15 ?T Area Debrided (L x W): ?otal 1.5 (cm) x 1.5 (cm) = 2.25 (cm?) ?Tissue and other material debrided: Non-Viable, Eschar ?Level: Non-Viable Tissue ?Debridement Description: Selective/Open Wound ?Instrument: Curette ?Bleeding: Minimum ?Hemostasis Achieved: Pressure ?End Time: 16:16 ?Procedural Pain: 0 ?Post Procedural Pain: 0 ?Response to Treatment: Procedure was tolerated well ?Level of Consciousness (Post- Awake and  Alert ?procedure): ?Post Debridement Measurements of Total Wound ?Length: (cm) 1.5 ?Width: (cm) 1.5 ?Depth: (cm) 0.1 ?Volume: (cm?) 0.177 ?Character of Wound/Ulcer Post Debridement: Improved ?Severity of Tissue Post Debridement: Fat layer exposed ?Post Procedure Diagnosis ?Same as Pre-procedure ?Electronic Signature(s) ?Signed: 01/18/2022 5:05:56 PM By: Fredirick Maudlin MD FACS ?Signed: 01/18/2022 5:38:47 PM By: Levan Hurst RN, BSN ?Entered By: Levan Hurst on 01/18/2022 16:16:31 ?-------------------------------------------------------------------------------- ?HPI Details ?Patient Name: Date of Service: ?[name], MA RGA RET C. 01/18/2022 3:45 PM ?Medical Record Number: [id number] ?Patient Account Number: [id number] ?Date of Birth/Sex: Treating RN: ?August 29, 1941 (81 y.o. 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 She is accompanied by her son who helps provide some of the medical history, in addition to review of the electronic medical records. ?Apparently, starting around Thanksgiving, her legs became progressively more strolling and red. They eventually broke open with multiple weeping ulcers. She ?was treated for cellulitis with a series of courses of antibiotics including doxycycline Keflex moxifloxacin (which he did not tolerate) and ciprofloxacin. Despite ?this, her wounds did not improve. Apparently, they were applying hydrogen peroxide to the wounds. She was referred to the lymphedema clinic at [facility]. Due to the open wounds, no significant intervention could be implemented. She was referred to the wound center in order to  address ?the open wounds. She has what sounds like a very old pair of juxta lite stockings that she does not use; they are likely too old to be effective anymore at this ?time. She does not wear any other form of compression stocking and she does not have lymphedema pumps. ?The wounds are on her bilateral lower extremities. The right is nearly circumferential and is covered with heavy crusting and eschar. Some areas are extremely ?dry and  others are moist and have significant slough. On the left, the area is much smaller but also has crusted eschar and slough. There is a fairly strong ?odor coming from the wounds. The drainage does not appear purulent. They are not particularly tender. ?01/04/2022: T oday, her wounds are much improved. The right sided wound has partially healed, separating the previously circumferential wound into anterior and ?posterior sites. There is significantly less slough and the moisture balance is more appropriate. The wound on the left is nearly closed with just a punctate ?opening. It is clean without any significant drainage. There is no odor. ?01/13/2022: The right sided wound is much smaller and more epithelialized. No slough buildup and edema control is good. The left-sided wound is closed, but she ?has a new open area just above where her compression wraps were. This appears to be a small blister that has opened. No odor and no significant drainage ?from any of the wounds. ?01/18/2022: There is just a small open area on the posterior calf on the right. The area that opened just above her compression wraps on the left is smaller and ?nearly closed. ?Electronic Signature(s) ?Signed: 01/18/2022 4:53:39 PM By: Fredirick Maudlin MD FACS ?Entered By: Fredirick Maudlin on 01/18/2022 16:53:38 ?-------------------------------------------------------------------------------- ?Physical Exam Details ?Patient Name: Date of Service: ?[name], MA RGA RET C. 01/18/2022 3:45 PM ?Medical Record Number:  [id number] ?Patient Account Number: [id number] ?Date of Birth/Sex: Treating RN: ?[birth year] (81 y.o. [name], [name] ?Primary Care Provider: Donald Prose Other Clinician: ?Referring Provider: ?Treating Provider/Extender: Fredirick Maudlin ?Donald Prose ?Weeks in Treatment: 3 ?Constitutional ?She is hypertensive, but asymptomatic.. . . . No acute distress. ?Respiratory ?Normal work of breathing on room air. ?Notes ?01/18/2022: The right sided wound is nearly healed with just a small open area on the posterior calf. The left side where she had a small area open last week is ?much smaller. There is no odor or significant drainage from either site. Small amount of eschar on the right sided wound. ?Electronic Signature(s) ?Signed: 01/18/2022 4:58:14 PM By: Fredirick Maudlin MD FACS ?Entered By: Fredirick Maudlin on 01/18/2022 16:58:14 ?-------------------------------------------------------------------------------- ?Physician Orders Details ?Patient Name: Date of Service: ?[name], MA RGA RET C. 01/18/2022 3:45 PM ?Medical Record Number: [id number] ?Patient Account Number: [id number] ?Date of Birth/Sex: Treating RN: ?06/27/1941 (81 y.o. 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 - Dr. Celine Ahr - Room 2 ?Return A ?Bathing/ Shower/ Hygiene ?May shower with protection but do not get wound dressing(s) wet. - Ok to use Biomedical engineer, can buy at [facility], [place], or [place] ?Edema Control - Lymphedema / SCD / Other ?Elevate legs to the level of the  heart or above for 30 minutes daily and/or when sitting, a frequency of: - throughout the day ?Avoid standing for long periods of time. ?Exercise regularly ?Wound Treatment ?Wound #2 - Lower Leg Wound Laterality: Left, La

## 2022-01-20 NOTE — Progress Notes (Signed)
Geeslin, Tationna C. ([id number]) ?Visit Report for 01/18/2022 ?Arrival Information Details ?Patient Name: Date of Service: ?[name], MA RGA RET C. 01/18/2022 3:45 PM ?Medical Record Number: [id number] ?Patient Account Number: [id number] ?Date of Birth/Sex: Treating RN: ?[birth year] (81 y.o. [name], [name] ?Primary Care Deaysia Grigoryan: Donald Prose Other Clinician: ?Referring Sheena Donegan: ?Treating Redell Bhandari/Extender: Fredirick Maudlin ?Donald Prose ?Weeks in Treatment: 3 ?Visit Information History Since Last Visit ?Added or deleted any medications: No ?Patient Arrived: [name] ?Any new allergies or adverse reactions: No ?Arrival Time: 15:48 ?Had a fall or experienced change in No ?Accompanied By: self ?activities of daily living that may affect ?Transfer Assistance: None ?risk of falls: ?Patient Identification Verified: Yes ?Signs or symptoms of abuse/neglect since last visito No ?Secondary Verification Process Completed: Yes ?Hospitalized since last visit: No ?Patient Requires Transmission-Based Precautions: No ?Implantable device outside of the clinic excluding No ?Patient Has Alerts: No ?cellular tissue based products placed in the center ?since last visit: ?Has Dressing in Place as Prescribed: Yes ?Pain Present Now: No ?Electronic Signature(s) ?Signed: 01/20/2022 9:12:08 AM By: Sandre Kitty ?Entered By: Sandre Kitty on 01/18/2022 15:49:06 ?-------------------------------------------------------------------------------- ?Compression Therapy Details ?Patient Name: Date of Service: ?[name], MA RGA RET C. 01/18/2022 3:45 PM ?Medical Record Number: [id number] ?Patient Account Number: [id number] ?Date of Birth/Sex: Treating RN: ?1941/03/28 (81 y.o. [name], [name] ?Primary Care Markie Frith: Donald Prose Other Clinician: ?Referring Earnie Rockhold: ?Treating Saksham Akkerman/Extender: Fredirick Maudlin ?Donald Prose ?Weeks in Treatment: 3 ?Compression Therapy Performed for Wound Assessment: Wound #2 Left,Lateral,Anterior Lower Leg ?Performed By:  Clinician Levan Hurst, RN ?Compression Type: Three Layer ?Post Procedure Diagnosis ?Same as Pre-procedure ?Electronic Signature(s) ?Signed: 01/18/2022 5:38:47 PM By: Levan Hurst RN, BSN ?Entered By: Levan Hurst on 01/18/2022 16:16:48 ?-------------------------------------------------------------------------------- ?Compression Therapy Details ?Patient Name: ?Date of Service: ?[name], MA RGA RET C. 01/18/2022 3:45 PM ?Medical Record Number: [id number] ?Patient Account Number: [id number] ?Date of Birth/Sex: ?Treating RN: ?1941-04-10 (81 y.o. [name], [name] ?Primary Care Merric Yost: Donald Prose ?Other Clinician: ?Referring Coury Grieger: ?Treating Shjon Lizarraga/Extender: Fredirick Maudlin ?Donald Prose ?Weeks in Treatment: 3 ?Compression Therapy Performed for Wound Assessment: Wound #3 Right,Posterior Lower Leg ?Performed By: Clinician Levan Hurst, RN ?Compression Type: Three Layer ?Post Procedure Diagnosis ?Same as Pre-procedure ?Electronic Signature(s) ?Signed: 01/18/2022 5:38:47 PM By: Levan Hurst RN, BSN ?Entered By: Levan Hurst on 01/18/2022 16:16:48 ?-------------------------------------------------------------------------------- ?Encounter Discharge Information Details ?Patient Name: ?Date of Service: ?[name], MA RGA RET C. 01/18/2022 3:45 PM ?Medical Record Number: [id number] ?Patient Account Number: [id number] ?Date of Birth/Sex: ?Treating RN: ?22-Aug-1941 (81 y.o. [name], [name] ?Primary Care Shishir Krantz: Donald Prose ?Other Clinician: ?Referring Khaleesi Gruel: ?Treating Zaidy Absher/Extender: Fredirick Maudlin ?Donald Prose ?Weeks in Treatment: 3 ?Encounter Discharge Information Items Post Procedure Vitals ?Discharge Condition: Stable ?Temperature (F): 97.8 ?Ambulatory Status: [name] ?Pulse (bpm): 97 ?Discharge Destination: Home ?Respiratory Rate (breaths/min): 18 ?Transportation: Private Auto ?Blood Pressure (mmHg): 159/81 ?Accompanied By: son ?Schedule Follow-up Appointment: Yes ?Clinical Summary of Care: Patient  Declined ?Electronic Signature(s) ?Signed: 01/18/2022 5:38:47 PM By: Levan Hurst RN, BSN ?Entered By: Levan Hurst on 01/18/2022 17:36:06 ?-------------------------------------------------------------------------------- ?Lower Extremity Assessment Details ?Patient Name: ?Date of Service: ?[name], MA RGA RET C. 01/18/2022 3:45 PM ?Medical Record Number: [id number] ?Patient Account Number: [id number] ?Date of Birth/Sex: ?Treating RN: ?09/16/1941 (81 y.o. [name], [name] ?Primary Care Stanisha Lorenz: Donald Prose ?Other Clinician: ?Referring Sherlyne Crownover: ?Treating Lincoln Ginley/Extender: Fredirick Maudlin ?Donald Prose ?Weeks in Treatment: 3 ?Edema Assessment ?Assessed: [Left: No] [Right: No] ?Edema: [Left: Yes] [Right: Yes] ?Calf ?Left: Right: ?Point of Measurement: 26 cm From Medial Instep 31 cm 33.5 cm ?Ankle ?  Left: Right: ?Point of Measurement: 10 cm From Medial Instep 23 cm 25.5 cm ?Knee To Floor ?Left: Right: ?From Medial Instep 34 cm 34 cm ?Vascular Assessment ?Pulses: ?Dorsalis Pedis ?Palpable: [Left:Yes] [Right:Yes] ?Electronic Signature(s) ?Signed: 01/18/2022 5:38:47 PM By: Levan Hurst RN, BSN ?Entered By: Levan Hurst on 01/18/2022 16:59:40 ?-------------------------------------------------------------------------------- ?Multi Wound Chart Details ?Patient Name: ?Date of Service: ?[name], MA RGA RET C. 01/18/2022 3:45 PM ?Medical Record Number: [id number] ?Patient Account Number: [id number] ?Date of Birth/Sex: ?Treating RN: ?[birth year] (81 y.o. 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 ?Primary Etiology: ?Lymphedema, Congestive Heart Lymphedema, Congestive Heart Lymphedema, Congestive Heart ?Comorbid History: ?Failure, Hypertension, Osteoarthritis Failure, Hypertension, Osteoarthritis Failure, Hypertension, Osteoarthritis ?08/21/2021 08/20/2021 01/13/2022 ?Date Acquired: ?3 3 0 ?Weeks of Treatment: ?Healed - Epithelialized Open Open ?Wound Status: ?No No No ?Wound Recurrence: ?0x0x0 0.2x0.2x0.2 1.5x1.5x0.1 ?Measurements L x W x D (cm) ?0 0.031 1.767 ?A (cm?) : ?rea ?0 0.006 0.177 ?Volume (cm?) : ?100.00% 99.90% 71.30% ?% Reduction in Area: ?100.00% 99.70% 71.20% ?% Reduction in Volume: ?Full Thickness Without Exposed Full Thickness Without Exposed Full Thickness Without Exposed ?Classification: ?Support Structures Support Structures Support Structures ?None Present Small Medium ?Exudate Amount: ?N/A Serosanguineous Serosanguineous ?Exudate Type: ?N/A red, brown red, brown ?Exudate Color: ?Flat and Intact Flat and Intact Flat and Intact ?Wound Margin: ?None Present (0%) Large (67-100%) Large (67-100%) ?Granulation Amount: ?N/A Pink Pink ?Granulation Quality: ?None Present (0%) None Present (0%) Small (1-33%) ?Necrotic Amount: ?Fascia: No ?Fat Layer (Subcutaneous Tissue): Yes Fat Layer (Subcutaneous Tissue): Yes ?Exposed Structures: ?Fat Layer (Subcutaneous Tissue): No ?Fascia: No ?Fascia: No ?Tendon: No ?Tendon: No ?Tendon: No ?Muscle: No ?Muscle: No ?Muscle: No ?Joint: No ?Joint: No ?Joint: No ?Bone: No ?Bone: No ?Bone: No ?Large (67-100%) Large (67-100%) Medium (34-66%) ?Epithelialization: ?N/A N/A Debridement - Selective/Open Wound ?Debridement: ?Pre-procedure Verification/Time Out N/A N/A 16:15 ?Taken: ?N/A N/A Necrotic/Eschar ?Tissue Debrided: ?N/A N/A Non-Viable Tissue ?Level: ?N/A N/A 2.25 ?Debridement A (sq cm): ?rea ?N/A N/A Curette ?Instrument: ?N/A N/A  Minimum ?Bleeding: ?N/A N/A Pressure ?Hemostasis A chieved: ?N/A N/A 0 ?Procedural Pain: ?N/A N/A 0 ?Post Procedural Pain: ?N/A N/A Procedure was tolerated well ?Debridement Treatment Response: ?N/A N/A 1.5x1.5x0.1 ?Post Debridemen

## 2022-01-24 ENCOUNTER — Encounter: Payer: Medicare Other | Admitting: Occupational Therapy

## 2022-01-25 ENCOUNTER — Encounter: Payer: Medicare Other | Admitting: Occupational Therapy

## 2022-01-26 ENCOUNTER — Encounter: Payer: Medicare Other | Admitting: Occupational Therapy

## 2022-01-26 ENCOUNTER — Encounter (HOSPITAL_BASED_OUTPATIENT_CLINIC_OR_DEPARTMENT_OTHER): Payer: Medicare Other | Attending: General Surgery | Admitting: General Surgery

## 2022-01-26 DIAGNOSIS — I5032 Chronic diastolic (congestive) heart failure: Secondary | ICD-10-CM | POA: Insufficient documentation

## 2022-01-26 DIAGNOSIS — L97211 Non-pressure chronic ulcer of right calf limited to breakdown of skin: Secondary | ICD-10-CM | POA: Insufficient documentation

## 2022-01-26 DIAGNOSIS — I890 Lymphedema, not elsewhere classified: Secondary | ICD-10-CM | POA: Insufficient documentation

## 2022-01-26 DIAGNOSIS — I110 Hypertensive heart disease with heart failure: Secondary | ICD-10-CM | POA: Insufficient documentation

## 2022-01-27 ENCOUNTER — Encounter: Payer: Medicare Other | Admitting: Occupational Therapy

## 2022-01-30 ENCOUNTER — Encounter: Payer: Medicare Other | Admitting: Occupational Therapy

## 2022-01-30 ENCOUNTER — Encounter (HOSPITAL_BASED_OUTPATIENT_CLINIC_OR_DEPARTMENT_OTHER): Payer: Medicare Other | Admitting: General Surgery

## 2022-01-30 DIAGNOSIS — I110 Hypertensive heart disease with heart failure: Secondary | ICD-10-CM | POA: Diagnosis not present

## 2022-01-30 DIAGNOSIS — L97211 Non-pressure chronic ulcer of right calf limited to breakdown of skin: Secondary | ICD-10-CM | POA: Diagnosis not present

## 2022-01-30 DIAGNOSIS — I5032 Chronic diastolic (congestive) heart failure: Secondary | ICD-10-CM | POA: Diagnosis not present

## 2022-01-30 DIAGNOSIS — I890 Lymphedema, not elsewhere classified: Secondary | ICD-10-CM | POA: Diagnosis present

## 2022-01-30 NOTE — Progress Notes (Signed)
Cathy [name], Cathy C. ([id number]) ?Visit Report for 01/30/2022 ?Arrival Information Details ?Patient Name: Date of Service: ?[name], MA RGA RET C. 01/30/2022 3:00 PM ?Medical Record Number: [id number] ?Patient Account Number: [id number] ?Date of Birth/Sex: Treating RN: ?1941/06/26 (81 y.o. [name] ?Primary Care Walaa Carel: Donald Prose Other Clinician: ?Referring Anterio Scheel: ?Treating Cire Deyarmin/Extender: Fredirick Maudlin ?Donald Prose ?Weeks in Treatment: 4 ?Visit Information History Since Last Visit ?Added or deleted any medications: No ?Patient Arrived: Cathy [name] ?Any new allergies or adverse reactions: No ?Arrival Time: 15:00 ?Had a fall or experienced change in No ?Accompanied By: self ?activities of daily living that may affect ?Transfer Assistance: Manual ?risk of falls: ?Patient Identification Verified: Yes ?Signs or symptoms of abuse/neglect since last visito No ?Secondary Verification Process Completed: Yes ?Hospitalized since last visit: No ?Patient Requires Transmission-Based Precautions: No ?Implantable device outside of the clinic excluding No ?Patient Has Alerts: No ?cellular tissue based products placed in the center ?since last visit: ?Has Dressing in Place as Prescribed: Yes ?Has Compression in Place as Prescribed: Yes ?Pain Present Now: No ?Electronic Signature(s) ?Signed: 01/30/2022 4:43:42 PM By: Adline Peals ?Entered By: Adline Peals on 01/30/2022 15:03:39 ?-------------------------------------------------------------------------------- ?Compression Therapy Details ?Patient Name: Date of Service: ?[name], MA RGA RET C. 01/30/2022 3:00 PM ?Medical Record Number: [id number] ?Patient Account Number: [id number] ?Date of Birth/Sex: Treating RN: ?17-Aug-1941 (81 y.o. [name] ?Primary Care Keylin Podolsky: Donald Prose Other Clinician: ?Referring Aoki Wedemeyer: ?Treating Mikaele Stecher/Extender: Fredirick Maudlin ?Donald Prose ?Weeks in Treatment: 4 ?Compression Therapy Performed for Wound Assessment:  NonWound Condition Lymphedema - Bilateral Leg ?Performed By: Clinician Adline Peals, RN ?Compression Type: Four Layer ?Post Procedure Diagnosis ?Same as Pre-procedure ?Electronic Signature(s) ?Signed: 01/30/2022 4:43:42 PM By: Adline Peals ?Entered By: Adline Peals on 01/30/2022 15:32:56 ?-------------------------------------------------------------------------------- ?Encounter Discharge Information Details ?Patient Name: ?Date of Service: ?[name], MA RGA RET C. 01/30/2022 3:00 PM ?Medical Record Number: [id number] ?Patient Account Number: [id number] ?Date of Birth/Sex: ?Treating RN: ?23-Nov-1940 (81 y.o. [name] ?Primary Care [name]: Donald Prose ?Other Clinician: ?Referring Brittanyann Wittner: ?Treating Gunhild Bautch/Extender: Fredirick Maudlin ?Donald Prose ?Weeks in Treatment: 4 ?Encounter Discharge Information Items ?Discharge Condition: Stable ?Ambulatory Status: Cathy [name] ?Discharge Destination: Home ?Transportation: Private Auto ?Accompanied By: self ?Schedule Follow-up Appointment: Yes ?Clinical Summary of Care: Patient Declined ?Electronic Signature(s) ?Signed: 01/30/2022 4:43:42 PM By: Adline Peals ?Entered By: Adline Peals on 01/30/2022 15:48:23 ?-------------------------------------------------------------------------------- ?Lower Extremity Assessment Details ?Patient Name: ?Date of Service: ?[name], MA RGA RET C. 01/30/2022 3:00 PM ?Medical Record Number: [id number] ?Patient Account Number: [id number] ?Date of Birth/Sex: ?Treating RN: ?28-Dec-1940 (81 y.o. [name] ?Primary Care Cathy [name]: Donald Prose ?Other Clinician: ?Referring Malonie Tatum: ?Treating Marshae Azam/Extender: Fredirick Maudlin ?Donald Prose ?Weeks in Treatment: 4 ?Edema Assessment ?Assessed: [Left: No] [Right: No] ?Edema: [Left: Yes] [Right: Yes] ?Calf ?Left: Right: ?Point of Measurement: 26 cm From Medial Instep 32.8 cm 34 cm ?Ankle ?Left: Right: ?Point of Measurement: 10 cm From Medial Instep 23 cm 23.4 cm ?Vascular  Assessment ?Pulses: ?Dorsalis Pedis ?Palpable: [Left:Yes] [Right:Yes] ?Electronic Signature(s) ?Signed: 01/30/2022 4:43:42 PM By: Adline Peals ?Entered By: Adline Peals on 01/30/2022 15:09:53 ?-------------------------------------------------------------------------------- ?Multi Wound Chart Details ?Patient Name: ?Date of Service: ?[name], MA RGA RET C. 01/30/2022 3:00 PM ?Medical Record Number: [id number] ?Patient Account Number: [id number] ?Date of Birth/Sex: ?Treating RN: ?[birth year] (81 y.o. Cathy [name], [name] ?Primary Care [name]: Donald Prose ?Other Clinician: ?Referring Emalene Welte: ?Treating Whitten Andreoni/Extender: Fredirick Maudlin ?Donald Prose ?Weeks in Treatment: 4 ?Vital Signs ?Height(in): 58 ?Pulse(bpm): 132 ?Weight(lbs): 138 ?Blood Pressure(mmHg): 166/82 ?Body Mass Index(BMI): 28.8 ?Temperature(??F): 98.3 ?Respiratory Rate(breaths/min):  18 ?Photos: [N/A:N/A] ?Left, Lateral, Anterior Lower Leg Right, Posterior Lower Leg N/A ?Wound Location: ?Gradually Appeared Gradually Appeared N/A ?Wounding Event: ?Venous Leg Ulcer Venous Leg Ulcer N/A ?Primary Etiology: ?Lymphedema, Congestive Heart Lymphedema, Congestive Heart N/A ?Comorbid History: ?Failure, Hypertension, Osteoarthritis Failure, Hypertension, Osteoarthritis ?08/20/2021 01/13/2022 N/A ?Date Acquired: ?4 2 N/A ?Weeks of Treatment: ?Open Open N/A ?Wound Status: ?No No N/A ?Wound Recurrence: ?0x0x0 0x0x0 N/A ?Measurements L x W x D (cm) ?0 0 N/A ?A (cm?) : ?rea ?0 0 N/A ?Volume (cm?) : ?100.00% 100.00% N/A ?% Reduction in Area: ?100.00% 100.00% N/A ?% Reduction in Volume: ?Full Thickness Without Exposed Full Thickness Without Exposed N/A ?Classification: ?Support Structures Support Structures ?None Present None Present N/A ?Exudate Amount: ?Flat and Intact Flat and Intact N/A ?Wound Margin: ?None Present (0%) None Present (0%) N/A ?Granulation Amount: ?None Present (0%) None Present (0%) N/A ?Necrotic Amount: ?Fascia: No ?Fascia: No N/A ?Exposed  Structures: ?Fat Layer (Subcutaneous Tissue): No ?Fat Layer (Subcutaneous Tissue): No ?Tendon: No ?Tendon: No ?Muscle: No ?Muscle: No ?Joint: No ?Joint: No ?Bone: No ?Bone: No ?Large (67-100%) Large (67-100%) N/A ?Epithelialization: ?Treatment Notes ?Wound #2 (Lower Leg) Wound Laterality: Left, Lateral, Anterior ?Cleanser ?Peri-Wound Care ?Topical ?Primary Dressing ?Secondary Dressing ?Secured With ?Compression Wrap ?Compression Stockings ?Add-Ons ?Wound #3 (Lower Leg) Wound Laterality: Right, Posterior ?Cleanser ?Peri-Wound Care ?Topical ?Primary Dressing ?Secondary Dressing ?Secured With ?Compression Wrap ?Compression Stockings ?Add-Ons ?Electronic Signature(s) ?Signed: 01/30/2022 3:51:18 PM By: Fredirick Maudlin MD FACS ?Signed: 01/30/2022 5:39:57 PM By: Levan Hurst RN, BSN ?Entered By: Fredirick Maudlin on 01/30/2022 15:51:18 ?-------------------------------------------------------------------------------- ?Multi-Disciplinary Care Plan Details ?Patient Name: ?Date of Service: ?[name], MA RGA RET C. 01/30/2022 3:00 PM ?Medical Record Number: [id number] ?Patient Account Number: [id number] ?Date of Birth/Sex: ?Treating RN: ?[birth year] (81 y.o. [name] ?Primary Care Shakeera Rightmyer: Donald Prose ?Other Clinician: ?Referring Tykera Skates: ?Treating Karla Pavone/Extender: Fredirick Maudlin ?Donald Prose ?Weeks in Treatment: 4 ?Multidisciplinary Care Plan reviewed with physician ?Active Inactive ?Electronic Signature(s) ?Signed: 01/30/2022 4:43:42 PM By: Adline Peals ?Entered By: Adline Peals on 01/30/2022 16:26:41 ?-------------------------------------------------------------------------------- ?Pain Assessment Details ?Patient Name: ?Date of Service: ?[name], MA RGA RET C. 01/30/2022 3:00 PM ?Medical Record Number: [id number] ?Patient Account Number: [id number] ?Date of Birth/Sex: ?Treating RN: ?01/06/1941 (81 y.o. [name] ?Primary Care Tzion Wedel: Donald Prose ?Other Clinician: ?Referring Taline Nass: ?Treating  Liylah Najarro/Extender: Fredirick Maudlin ?Donald Prose ?Weeks in Treatment: 4 ?Active Problems ?Location of Pain Severity and Description of Pain ?Patient Has Paino No ?Site Locations ?Rate the pain. ?Current Pain Level: 0 ?

## 2022-01-30 NOTE — Progress Notes (Signed)
Mounts, Katherina C. ([id number]) ?Visit Report for 01/30/2022 ?Chief Complaint Document Details ?Patient Name: Date of Service: ?[name], MA RGA RET C. 01/30/2022 3:00 PM ?Medical Record Number: [id number] ?Patient Account Number: [id number] ?Date of Birth/Sex: Treating RN: ?[birth year] (81 y.o. [name], [name] ?Primary Care Provider: Donald Prose Other Clinician: ?Referring Provider: ?Treating Provider/Extender: Fredirick Maudlin ?Donald Prose ?Weeks in Treatment: 4 ?Information Obtained from: Patient ?Chief Complaint ?Patient seen for complaints of Non-Healing Wounds. ?Electronic Signature(s) ?Signed: 01/30/2022 3:51:27 PM By: Fredirick Maudlin MD FACS ?Entered By: Fredirick Maudlin on 01/30/2022 15:51:27 ?-------------------------------------------------------------------------------- ?HPI Details ?Patient Name: Date of Service: ?[name], MA RGA RET C. 01/30/2022 3:00 PM ?Medical Record Number: [id number] ?Patient Account Number: [id number] ?Date of Birth/Sex: Treating RN: ?10-Jul-1941 (81 y.o. [name], [name] ?Primary Care Provider: Donald Prose Other Clinician: ?Referring Provider: ?Treating Provider/Extender: Fredirick Maudlin ?Donald Prose ?Weeks in Treatment: 4 ?History of Present Illness ?HPI Description: ADMISSION ?12/28/2021 ?This is a 81 year old woman who has been referred for further evaluation and management of bilateral lower extremity ulcers secondary to uncontrolled ?lymphedema. She is accompanied by her son who helps provide some of the medical history, in addition to review of the electronic medical records. ?Apparently, starting around Thanksgiving, her legs became progressively more strolling and red. They eventually broke open with multiple weeping ulcers. She ?was treated for cellulitis with a series of courses of antibiotics including doxycycline Keflex moxifloxacin (which he did not tolerate) and ciprofloxacin. Despite ?this, her wounds did not improve. Apparently, they were applying hydrogen peroxide to the  wounds. She was referred to the lymphedema clinic at [facility]. Due to the open wounds, no significant intervention could be implemented. She was referred to the wound center in order to address ?the open wounds. She has what sounds like a very old pair of juxta lite stockings that she does not use; they are likely too old to be effective anymore at this ?time. She does not wear any other form of compression stocking and she does not have lymphedema pumps. ?The wounds are on her bilateral lower extremities. The right is nearly circumferential and is covered with heavy crusting and eschar. Some areas are extremely ?dry and others are moist and have significant slough. On the left, the area is much smaller but also has crusted eschar and slough. There is a fairly strong ?odor coming from the wounds. The drainage does not appear purulent. They are not particularly tender. ?01/04/2022: T oday, her wounds are much improved. The right sided wound has partially healed, separating the previously circumferential wound into anterior and ?posterior sites. There is significantly less slough and the moisture balance is more appropriate. The wound on the left is nearly closed with just a punctate ?opening. It is clean without any significant drainage. There is no odor. ?01/13/2022: The right sided wound is much smaller and more epithelialized. No slough buildup and edema control is good. The left-sided wound is closed, but she ?has a new open area just above where her compression wraps were. This appears to be a small blister that has opened. No odor and no significant drainage ?from any of the wounds. ?01/18/2022: There is just a small open area on the posterior calf on the right. The area that opened just above her compression wraps on the left is smaller and ?nearly closed. ?01/30/2022: Her wounds are healed. She did not bring the compression stockings that we ordered for her; she says they were not  received. We do have a photo ?from  the delivery and she agrees that this is her place of residence. She resides in a retirement community so perhaps somebody else picked them up. ?Electronic Signature(s) ?Signed: 01/30/2022 3:52:24 PM By: Fredirick Maudlin MD FACS ?Entered By: Fredirick Maudlin on 01/30/2022 15:52:24 ?-------------------------------------------------------------------------------- ?Physical Exam Details ?Patient Name: ?Date of Service: ?[name], MA RGA RET C. 01/30/2022 3:00 PM ?Medical Record Number: [id number] ?Patient Account Number: [id number] ?Date of Birth/Sex: ?Treating RN: ?10-20-1940 (81 y.o. [name], [name] ?Primary Care Provider: Donald Prose ?Other Clinician: ?Referring Provider: ?Treating Provider/Extender: Fredirick Maudlin ?Donald Prose ?Weeks in Treatment: 4 ?Constitutional ?She is hypertensive, but asymptomatic.. Tachycardic. . . No acute distress. ?Respiratory ?Normal work of breathing on room air. ?Notes ?01/30/2022: Her wounds are healed. Edema control is adequate. ?Electronic Signature(s) ?Signed: 01/30/2022 3:53:13 PM By: Fredirick Maudlin MD FACS ?Entered By: Fredirick Maudlin on 01/30/2022 15:53:13 ?-------------------------------------------------------------------------------- ?Physician Orders Details ?Patient Name: ?Date of Service: ?[name], MA RGA RET C. 01/30/2022 3:00 PM ?Medical Record Number: [id number] ?Patient Account Number: [id number] ?Date of Birth/Sex: ?Treating RN: ?26-Apr-1941 (81 y.o. [name] ?Primary Care Provider: Donald Prose ?Other Clinician: ?Referring Provider: ?Treating Provider/Extender: Fredirick Maudlin ?Donald Prose ?Weeks in Treatment: 4 ?Verbal / Phone Orders: No ?Diagnosis Coding ?ICD-10 Coding ?Code Description ?I89.0 Lymphedema, not elsewhere classified ?V37.48 Chronic diastolic (congestive) heart failure ?Discharge From [facility] ?Discharge from [place]!! ?Edema Control - Lymphedema / SCD / Other ?Elevate legs to the level of  the heart or above for 30 minutes daily and/or when sitting, a frequency of: - throughout the day ?Avoid standing for long periods of time. ?Patient to wear own compression stockings every day. - may remove wraps once you find stockings ?Exercise regularly ?Moisturize legs daily. ?Electronic Signature(s) ?Signed: 01/30/2022 3:53:32 PM By: Fredirick Maudlin MD FACS ?Entered By: Fredirick Maudlin on 01/30/2022 15:53:32 ?-------------------------------------------------------------------------------- ?Problem List Details ?Patient Name: ?Date of Service: ?[name], MA RGA RET C. 01/30/2022 3:00 PM ?Medical Record Number: [id number] ?Patient Account Number: [id number] ?Date of Birth/Sex: ?Treating RN: ?January 03, 1941 (81 y.o. 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 01/30/2022 3:00 PM ?Medical Record Number: [id number] ?Patient Account Number: [id number] ?Date of Birth/Sex: ?Treating RN: ?08/21/1941 (81 y.o. [name], [name] ?Primary Care  Provider: Donald Prose ?Other Clinician: ?Referring Provider: ?Treating Provider/Extender: Fredirick Maudlin ?Donald Prose ?Weeks in Treatment: 4 ?Subjective ?Chief Complaint ?Information obtained from Patient ?Patie

## 2022-01-31 ENCOUNTER — Encounter: Payer: Medicare Other | Admitting: Occupational Therapy

## 2022-02-01 ENCOUNTER — Encounter: Payer: Medicare Other | Admitting: Occupational Therapy

## 2022-02-01 ENCOUNTER — Ambulatory Visit (HOSPITAL_BASED_OUTPATIENT_CLINIC_OR_DEPARTMENT_OTHER): Payer: Medicare Other | Admitting: General Surgery

## 2022-02-02 ENCOUNTER — Encounter: Payer: Medicare Other | Admitting: Occupational Therapy

## 2022-02-06 ENCOUNTER — Encounter: Payer: Medicare Other | Admitting: Occupational Therapy

## 2022-02-07 ENCOUNTER — Encounter (HOSPITAL_BASED_OUTPATIENT_CLINIC_OR_DEPARTMENT_OTHER): Payer: Medicare Other | Admitting: General Surgery

## 2022-02-07 ENCOUNTER — Encounter: Payer: Medicare Other | Admitting: Occupational Therapy

## 2022-02-07 DIAGNOSIS — I890 Lymphedema, not elsewhere classified: Secondary | ICD-10-CM | POA: Diagnosis not present

## 2022-02-07 NOTE — Progress Notes (Signed)
Bogucki, Cathy C. ([id number]) ?Visit Report for 02/07/2022 ?Arrival Information Details ?Patient Name: Date of Service: ?[name], MA RGA RET C. 02/07/2022 2:30 PM ?Medical Record Number: [id number] ?Patient Account Number: [id number] ?Date of Birth/Sex: Treating RN: ?[birth year] (81 y.o. F) Cathy [name] ?Primary Care Lenox Bink: Donald Prose Other Clinician: ?Referring Oluwatosin Bracy: ?Treating Zenobia Kuennen/Extender: Fredirick Maudlin ?Donald Prose ?Weeks in Treatment: 5 ?Visit Information History Since Last Visit ?Added or deleted any medications: No ?Patient Arrived: Cathy [name] ?Any new allergies or adverse reactions: No ?Arrival Time: 15:30 ?Had a fall or experienced change in No ?Accompanied By: daughter in law ?activities of daily living that may affect ?Transfer Assistance: Manual ?risk of falls: ?Patient Requires Transmission-Based Precautions: No ?Signs or symptoms of abuse/neglect since last visito No ?Patient Has Alerts: No ?Hospitalized since last visit: No ?Implantable device outside of the clinic excluding No ?cellular tissue based products placed in the center ?since last visit: ?Has Dressing in Place as Prescribed: Yes ?Pain Present Now: No ?Electronic Signature(s) ?Signed: 02/07/2022 4:57:09 PM By: Dellie Catholic RN ?Entered By: Dellie Catholic on 02/07/2022 15:30:39 ?-------------------------------------------------------------------------------- ?Compression Therapy Details ?Patient Name: Date of Service: ?[name], MA RGA RET C. 02/07/2022 2:30 PM ?Medical Record Number: [id number] ?Patient Account Number: [id number] ?Date of Birth/Sex: Treating RN: ?[birth year] (81 y.o. F) Cathy [name] ?Primary Care Lehua Flores: Donald Prose Other Clinician: ?Referring Lorin Gawron: ?Treating Khala Tarte/Extender: Fredirick Maudlin ?Donald Prose ?Weeks in Treatment: 5 ?Compression Therapy Performed for Wound Assessment: Wound #4 Left,Anterior Lower Leg ?Performed By: Clinician Dellie Catholic, RN ?Compression Type: Three Layer ?Post Procedure  Diagnosis ?Same as Pre-procedure ?Electronic Signature(s) ?Signed: 02/07/2022 4:57:09 PM By: Dellie Catholic RN ?Entered By: Dellie Catholic on 02/07/2022 16:14:20 ?-------------------------------------------------------------------------------- ?Compression Therapy Details ?Patient Name: ?Date of Service: ?[name], MA RGA RET C. 02/07/2022 2:30 PM ?Medical Record Number: [id number] ?Patient Account Number: [id number] ?Date of Birth/Sex: ?Treating RN: ?02-01-1941 (81 y.o. F) Cathy [name] ?Primary Care Blaire Hodsdon: Donald Prose ?Other Clinician: ?Referring Allysha Tryon: ?Treating Desyre Calma/Extender: Fredirick Maudlin ?Donald Prose ?Weeks in Treatment: 5 ?Compression Therapy Performed for Wound Assessment: Wound #5 Right,Anterior Lower Leg ?Performed By: Clinician Dellie Catholic, RN ?Compression Type: Three Layer ?Post Procedure Diagnosis ?Same as Pre-procedure ?Electronic Signature(s) ?Signed: 02/07/2022 4:57:09 PM By: Dellie Catholic RN ?Entered By: Dellie Catholic on 02/07/2022 16:14:20 ?-------------------------------------------------------------------------------- ?Compression Therapy Details ?Patient Name: ?Date of Service: ?[name], MA RGA RET C. 02/07/2022 2:30 PM ?Medical Record Number: [id number] ?Patient Account Number: [id number] ?Date of Birth/Sex: ?Treating RN: ?09/04/1941 (81 y.o. F) Cathy [name] ?Primary Care Cathy [name]: Donald Prose ?Other Clinician: ?Referring Cathy [name]: ?Treating Manami Tutor/Extender: Fredirick Maudlin ?Donald Prose ?Weeks in Treatment: 5 ?Compression Therapy Performed for Wound Assessment: Wound #6 Right,Posterior Lower Leg ?Performed By: Clinician Dellie Catholic, RN ?Compression Type: Three Layer ?Post Procedure Diagnosis ?Same as Pre-procedure ?Electronic Signature(s) ?Signed: 02/07/2022 4:57:09 PM By: Dellie Catholic RN ?Entered By: Dellie Catholic on 02/07/2022 16:14:20 ?-------------------------------------------------------------------------------- ?Encounter Discharge Information Details ?Patient  Name: ?Date of Service: ?[name], MA RGA RET C. 02/07/2022 2:30 PM ?Medical Record Number: [id number] ?Patient Account Number: [id number] ?Date of Birth/Sex: ?Treating RN: ?Apr 18, 1941 (81 y.o. F) Cathy [name] ?Primary Care Champayne Kocian: Donald Prose ?Other Clinician: ?Referring Cathy [name]: ?Treating Cathy [name]/Extender: Fredirick Maudlin ?Donald Prose ?Weeks in Treatment: 5 ?Encounter Discharge Information Items ?Discharge Condition: Stable ?Ambulatory Status: Cathy [name] ?Discharge Destination: Home ?Transportation: Private Auto ?Accompanied By: daughter-in-law ?Schedule Follow-up Appointment: Yes ?Clinical Summary of Care: Patient Declined ?Electronic Signature(s) ?Signed: 02/07/2022 4:57:09 PM By: Dellie Catholic RN ?Entered By: Dellie Catholic on 02/07/2022 16:55:58 ?-------------------------------------------------------------------------------- ?Lower Extremity Assessment Details ?Patient Name: ?Date of Service: ?MCCA  LL, MA RGA RET C. 02/07/2022 2:30 PM ?Medical Record Number: [id number] ?Patient Account Number: [id number] ?Date of Birth/Sex: ?Treating RN: ?1941/04/13 (81 y.o. F) Cathy [name] ?Primary Care Maylon Sailors: Donald Prose ?Other Clinician: ?Referring Cathy [name]: ?Treating Anjannette Gauger/Extender: Fredirick Maudlin ?Donald Prose ?Weeks in Treatment: 5 ?Edema Assessment ?Assessed: [Left: No] [Right: No] ?Edema: [Left: Yes] [Right: Yes] ?Calf ?Left: Right: ?Point of Measurement: 26 cm From Medial Instep 41 cm 42.5 cm ?Ankle ?Left: Right: ?Point of Measurement: 10 cm From Medial Instep 26.5 cm 26.4 cm ?Knee To Floor ?Left: Right: ?From Medial Instep 40 cm 40 cm ?Electronic Signature(s) ?Signed: 02/07/2022 4:57:09 PM By: Dellie Catholic RN ?Entered By: Dellie Catholic on 02/07/2022 16:01:37 ?-------------------------------------------------------------------------------- ?Multi Wound Chart Details ?Patient Name: ?Date of Service: ?[name], MA RGA RET C. 02/07/2022 2:30 PM ?Medical Record Number: [id number] ?Patient Account Number:  [id number] ?Date of Birth/Sex: ?Treating RN: ?September 06, 1941 (81 y.o. F) Cathy [name] ?Primary Care Rox Mcgriff: Donald Prose ?Other Clinician: ?Referring Brayant Dorr: ?Treating Lavana Huckeba/Extender: Fredirick Maudlin ?Donald Prose ?Weeks in Treatment: 5 ?Vital Signs ?Height(in): 58 ?Pulse(bpm): 100 ?Weight(lbs): 138 ?Blood Pressure(mmHg): 147/69 ?Body Mass Index(BMI): 28.8 ?Temperature(??F): 97.6 ?Respiratory Rate(breaths/min): 20 ?Photos: [4:Left, Anterior Lower Leg] [5:Right, Anterior Lower Leg] [6:Right, Posterior Lower Leg] ?Wound Location: [4:Gradually Appeared] [5:Gradually Appeared] [6:Gradually Appeared] ?Wounding Event: [4:Lymphedema] [5:Lymphedema] [6:Lymphedema] ?Primary Etiology: [4:Lymphedema, Congestive Heart] [5:Lymphedema, Congestive Heart] [6:Lymphedema, Congestive Heart] ?Comorbid History: [4:Failure, Hypertension, Osteoarthritis Failure, Hypertension, Osteoarthritis Failure, Hypertension, Osteoarthritis 02/02/2022] [5:02/02/2022] [6:02/02/2022] ?Date Acquired: [4:0] [5:0] [6:0] ?Weeks of Treatment: [4:Open] [5:Open] [6:Open] ?Wound Status: [4:No] [5:No] [6:No] ?Wound Recurrence: [4:Yes] [5:Yes] [6:Yes] ?Clustered Wound: [4:3x0.5x0.1] [5:4x0.6x0.1] [6:9x9x0.1] ?Measurements L x W x D (cm) [4:1.178] [5:1.885] [3:57.017] ?A (cm?) : ?rea [4:0.118] [5:0.188] [6:6.362] ?Volume (cm?) : [4:0.00%] [5:0.00%] [6:0.00%] ?% Reduction in Area: [4:0.00%] [5:0.00%] [6:0.00%] ?% Reduction in Volume: [4:Full Thickness Without Exposed] [5:Full Thickness Without Exposed] [6:Full Thickness Without Exposed] ?Classification: [4:Support Structures Large] [5:Support Structures Large] [6:Support Structures Large] ?Exudate Amount: [4:Serous] [5:Serous] [6:Serous] ?Exudate Type: [4:amber] [5:amber] [6:amber] ?Exudate Color: [4:Large (67-100%)] [5:Large (67-100%)] [6:Large (67-100%)] ?Granulation Amount: [4:Red] [5:Red] [6:N/A] ?Granulation Quality: [4:None Present (0%)] [5:None Present (0%)] [6:None Present (0%)] ?Necrotic Amount: ?[4:Fat  Layer (Subcutaneous Tissue): Yes Fat Layer (Subcutaneous Tissue): Yes Fat Layer (Subcutaneous Tissue): Yes] ?Exposed Structures: ?[4:Fascia: No Tendon: No Muscle: No Joint: No Bone: No None] [5:Fascia: No Tendon: N

## 2022-02-07 NOTE — Progress Notes (Signed)
Debruin, Kawana C. ([id number]) ?Visit Report for 02/07/2022 ?Chief Complaint Document Details ?Patient Name: Date of Service: ?[name], MA RGA RET C. 02/07/2022 2:30 PM ?Medical Record Number: [id number] ?Patient Account Number: [id number] ?Date of Birth/Sex: Treating RN: ?[birth year] (81 y.o. F) [name], [name] ?Primary Care Provider: Donald Prose Other Clinician: ?Referring Provider: ?Treating Provider/Extender: Fredirick Maudlin ?Donald Prose ?Weeks in Treatment: 5 ?Information Obtained from: Patient ?Chief Complaint ?Patient seen for complaints of Non-Healing Wounds. ?Electronic Signature(s) ?Signed: 02/07/2022 4:34:43 PM By: Fredirick Maudlin MD FACS ?Entered By: Fredirick Maudlin on 02/07/2022 16:34:43 ?-------------------------------------------------------------------------------- ?HPI Details ?Patient Name: Date of Service: ?[name], MA RGA RET C. 02/07/2022 2:30 PM ?Medical Record Number: [id number] ?Patient Account Number: [id number] ?Date of Birth/Sex: Treating RN: ?October 02, 1940 (81 y.o. F) [name], [name] ?Primary Care Provider: Donald Prose Other Clinician: ?Referring Provider: ?Treating Provider/Extender: Fredirick Maudlin ?Donald Prose ?Weeks in Treatment: 5 ?History of Present Illness ?HPI Description: ADMISSION ?12/28/2021 ?This is a 81 year old woman who has been referred for further evaluation and management of bilateral lower extremity ulcers secondary to uncontrolled ?lymphedema. She is accompanied by her son who helps provide some of the medical history, in addition to review of the electronic medical records. ?Apparently, starting around Thanksgiving, her legs became progressively more strolling and red. They eventually broke open with multiple weeping ulcers. She ?was treated for cellulitis with a series of courses of antibiotics including doxycycline Keflex moxifloxacin (which he did not tolerate) and ciprofloxacin. Despite ?this, her wounds did not improve. Apparently, they were applying hydrogen peroxide to  the wounds. She was referred to the lymphedema clinic at [facility]. Due to the open wounds, no significant intervention could be implemented. She was referred to the wound center in order to address ?the open wounds. She has what sounds like a very old pair of juxta lite stockings that she does not use; they are likely too old to be effective anymore at this ?time. She does not wear any other form of compression stocking and she does not have lymphedema pumps. ?The wounds are on her bilateral lower extremities. The right is nearly circumferential and is covered with heavy crusting and eschar. Some areas are extremely ?dry and others are moist and have significant slough. On the left, the area is much smaller but also has crusted eschar and slough. There is a fairly strong ?odor coming from the wounds. The drainage does not appear purulent. They are not particularly tender. ?01/04/2022: T oday, her wounds are much improved. The right sided wound has partially healed, separating the previously circumferential wound into anterior and ?posterior sites. There is significantly less slough and the moisture balance is more appropriate. The wound on the left is nearly closed with just a punctate ?opening. It is clean without any significant drainage. There is no odor. ?01/13/2022: The right sided wound is much smaller and more epithelialized. No slough buildup and edema control is good. The left-sided wound is closed, but she ?has a new open area just above where her compression wraps were. This appears to be a small blister that has opened. No odor and no significant drainage ?from any of the wounds. ?01/18/2022: There is just a small open area on the posterior calf on the right. The area that opened just above her compression wraps on the left is smaller and ?nearly closed. ?01/30/2022: Her wounds are healed. She did not bring the compression stockings that we ordered for her; she says they were not  received. We do have a photo ?from  the delivery and she agrees that this is her place of residence. She resides in a retirement community so perhaps somebody else picked them up. ?02/07/2022: Unfortunately she has new open wounds on her bilateral lower extremities. She says that the stockings that came with her juxta lites are too tight. ?The neoprene wraps themselves are fine but the underlying stocking has been causing a lot of friction and irritation to her skin as they are donned and doffed. ?She has ulcers on both legs, at the left ankle and right calf. They are superficial and limited to breakdown of skin but they are weeping serous fluid. ?Electronic Signature(s) ?Signed: 02/07/2022 4:36:07 PM By: Fredirick Maudlin MD FACS ?Entered By: Fredirick Maudlin on 02/07/2022 16:36:07 ?-------------------------------------------------------------------------------- ?Physical Exam Details ?Patient Name: Date of Service: ?[name], MA RGA RET C. 02/07/2022 2:30 PM ?Medical Record Number: [id number] ?Patient Account Number: [id number] ?Date of Birth/Sex: Treating RN: ?10-03-1940 (81 y.o. F) [name], [name] ?Primary Care Provider: Donald Prose Other Clinician: ?Referring Provider: ?Treating Provider/Extender: Fredirick Maudlin ?Donald Prose ?Weeks in Treatment: 5 ?Constitutional ?Slightly hypertensive. Slightly tachycardic, asymptomatic.. . . No acute distress. ?Respiratory ?Normal work of breathing on room air. ?Cardiovascular ?2+ pitting edema in the bilateral lower extremities. The skin is red and irritated.Marland Kitchen ?Notes ?02/07/2022: She has new areas of open skin breakdown on her left medial ankle and her right posterior calf. They are clean without any exudate or odor. There is ?some serous weeping present. The skin itself is fairly irritated-looking around the wounds. ?Electronic Signature(s) ?Signed: 02/07/2022 4:37:47 PM By: Fredirick Maudlin MD FACS ?Entered By: Fredirick Maudlin on 02/07/2022  16:37:47 ?-------------------------------------------------------------------------------- ?Physician Orders Details ?Patient Name: Date of Service: ?[name], MA RGA RET C. 02/07/2022 2:30 PM ?Medical Record Number: [id number] ?Patient Account Number: [id number] ?Date of Birth/Sex: Treating RN: ?02-27-1941 (81 y.o. F) [name], [name] ?Primary Care Provider: Donald Prose Other Clinician: ?Referring Provider: ?Treating Provider/Extender: Fredirick Maudlin ?Donald Prose ?Weeks in Treatment: 5 ?Verbal / Phone Orders: No ?Diagnosis Coding ?ICD-10 Coding ?Code Description ?I89.0 Lymphedema, not elsewhere classified ?Q76.19 Chronic diastolic (congestive) heart failure ?L97.321 Non-pressure chronic ulcer of left ankle limited to breakdown of skin ?L97.211 Non-pressure chronic ulcer of right calf limited to breakdown of skin ?Follow-up Appointments ?ppointment in 1 week. - Dr Celine Ahr ?Return A ?Bathing/ Shower/ Hygiene ?Other Bathing/Shower/Hygiene Orders/Instructions: - Please keep both legs dry. Use cast protectors for legs if showering or taking a bath ?Edema Control - Lymphedema / SCD / Other ?Bilateral Lower Extremities ?Avoid standing for long periods of time. ?Moisturize legs daily. ?Wound Treatment ?Wound #4 - Lower Leg Wound Laterality: Left, Anterior ?Cleanser: Soap and Water 1 x Per Week/15 Days ?Discharge Instructions: May shower and wash wound with dial antibacterial soap and water prior to dressing change. ?Cleanser: Wound Cleanser 1 x Per Week/15 Days ?Discharge Instructions: Cleanse the wound with wound cleanser prior to applying a clean dressing using gauze sponges, not tissue or cotton balls. ?Prim Dressing: KerraCel Ag Gelling Fiber Dressing, 4x5 in (silver alginate) 1 x Per Week/15 Days ?ary ?Discharge Instructions: Apply silver alginate to wound bed as instructed ?Secondary Dressing: ABD Pad, 8x10 1 x Per Week/15 Days ?Discharge Instructions: Apply over primary dressing as directed. ?Secondary Dressing: Woven  Gauze Sponge, Non-Sterile 4x4 in 1 x Per Week/15 Days ?Discharge Instructions: Apply over primary dressing as directed. ?Secured With: Transpore Surgical Tape, 2x10 (in/yd) 1 x Per Week/15 Days ?Discharge Instructions: Secure dressing with tape as

## 2022-02-08 ENCOUNTER — Encounter (HOSPITAL_BASED_OUTPATIENT_CLINIC_OR_DEPARTMENT_OTHER): Payer: Medicare Other | Admitting: General Surgery

## 2022-02-09 ENCOUNTER — Encounter (HOSPITAL_BASED_OUTPATIENT_CLINIC_OR_DEPARTMENT_OTHER): Payer: Medicare Other | Admitting: General Surgery

## 2022-02-09 ENCOUNTER — Encounter: Payer: Medicare Other | Admitting: Occupational Therapy

## 2022-02-10 ENCOUNTER — Encounter: Payer: Medicare Other | Admitting: Occupational Therapy

## 2022-02-13 ENCOUNTER — Encounter: Payer: Medicare Other | Admitting: Occupational Therapy

## 2022-02-14 ENCOUNTER — Encounter: Payer: Medicare Other | Admitting: Occupational Therapy

## 2022-02-15 ENCOUNTER — Encounter: Payer: Medicare Other | Admitting: Occupational Therapy

## 2022-02-15 ENCOUNTER — Encounter (HOSPITAL_BASED_OUTPATIENT_CLINIC_OR_DEPARTMENT_OTHER): Payer: Medicare Other | Admitting: General Surgery

## 2022-02-16 ENCOUNTER — Encounter: Payer: Medicare Other | Admitting: Occupational Therapy

## 2022-02-16 ENCOUNTER — Encounter (HOSPITAL_BASED_OUTPATIENT_CLINIC_OR_DEPARTMENT_OTHER): Payer: Medicare Other | Admitting: General Surgery

## 2022-02-16 DIAGNOSIS — I890 Lymphedema, not elsewhere classified: Secondary | ICD-10-CM | POA: Diagnosis not present

## 2022-02-16 NOTE — Progress Notes (Signed)
SATARA, [name] ([id number]) Visit Report for 02/16/2022 Arrival Information Details Patient Name: Date of Service: [place], [place] [facility] RET C. 02/16/2022 1:15 PM Medical Record Number: [id number] Patient Account Number: [id number] Date of Birth/Sex: Treating RN: [birth year] (81 y.o. Cathy [name] Primary Care Maylani Embree: Donald Prose Other Clinician: Referring Tamesha Ellerbrock: Treating Brylie Sneath/Extender: Claudia Desanctis Weeks in Treatment: 7 Visit Information History Since Last Visit Added or deleted any medications: No Patient Arrived: Cathy [name] Any new allergies or adverse reactions: No Arrival Time: 14:37 Had a fall or experienced change in No Accompanied By: son activities of daily living that may affect Transfer Assistance: None risk of falls: Patient Requires Transmission-Based Precautions: No Signs or symptoms of abuse/neglect since last visito No Patient Has Alerts: No Hospitalized since last visit: No Implantable device outside of the clinic excluding No cellular tissue based products placed in the center since last visit: Pain Present Now: No Electronic Signature(s) Signed: 02/16/2022 7:03:12 PM By: Dellie Catholic RN Entered By: Dellie Catholic on 02/16/2022 14:40:25 -------------------------------------------------------------------------------- Compression Therapy Details Patient Name: Date of Service: Cathy Jenkins, [place] [facility] RET C. 02/16/2022 1:15 PM Medical Record Number: [id number] Patient Account Number: [id number] Date of Birth/Sex: Treating RN: 11-27-1940 (81 y.o. Cathy [name] Primary Care Cathy [name]: Donald Prose Other Clinician: Referring Yuridia Couts: Treating Cathy [name]/Extender: Claudia Desanctis Weeks in Treatment: 7 Compression Therapy Performed for Wound Assessment: Non-Wound Location Performed By: Clinician Dellie Catholic, RN Compression Type: Three Layer Location: Lower Extremity, Left Post Procedure Diagnosis Same as  Pre-procedure Electronic Signature(s) Signed: 02/16/2022 7:03:12 PM By: Dellie Catholic RN Entered By: Dellie Catholic on 02/16/2022 15:07:53 -------------------------------------------------------------------------------- Compression Therapy Details Patient Name: Date of Service: Cathy Jenkins, MA [facility] RET C. 02/16/2022 1:15 PM Medical Record Number: [id number] Patient Account Number: [id number] Date of Birth/Sex: Treating RN: [birth year] (81 y.o. Cathy [name] Primary Care Tykiera Raven: Donald Prose Other Clinician: Referring Barnet Benavides: Treating Phinehas Grounds/Extender: Claudia Desanctis Weeks in Treatment: 7 Compression Therapy Performed for Wound Assessment: Wound #5 Right,Anterior Lower Leg Performed By: Clinician Dellie Catholic, RN Compression Type: Three Layer Post Procedure Diagnosis Same as Pre-procedure Electronic Signature(s) Signed: 02/16/2022 7:03:12 PM By: Dellie Catholic RN Entered By: Dellie Catholic on 02/16/2022 15:08:19 -------------------------------------------------------------------------------- Encounter Discharge Information Details Patient Name: Date of Service: Cathy Jenkins, MA [facility] RET C. 02/16/2022 1:15 PM Medical Record Number: [id number] Patient Account Number: [id number] Date of Birth/Sex: Treating RN: [birth year] (81 y.o. Cathy [name] Primary Care Cathy [name]: Donald Prose Other Clinician: Referring Sanskriti Greenlaw: Treating Laurelyn Terrero/Extender: Claudia Desanctis Weeks in Treatment: 7 Encounter Discharge Information Items Discharge Condition: Stable Ambulatory Status: Walker Discharge Destination: Home Transportation: Private Auto Accompanied By: son ( was in waiting room) Schedule Follow-up Appointment: Yes Clinical Summary of Care: Patient Declined Electronic Signature(s) Signed: 02/16/2022 7:03:12 PM By: Dellie Catholic RN Entered By: Dellie Catholic on 02/16/2022  17:22:31 -------------------------------------------------------------------------------- Lower Extremity Assessment Details Patient Name: Date of Service: [facility], [place] [facility] RET C. 02/16/2022 1:15 PM Medical Record Number: [id number] Patient Account Number: [id number] Date of Birth/Sex: Treating RN: [birth year] (81 y.o. Cathy [name] Primary Care [name]: Donald Prose Other Clinician: Referring Cathy [name]: Treating Cathy [name]/Extender: Claudia Desanctis Weeks in Treatment: 7 Edema Assessment Assessed: [Left: No] [Right: No] Edema: [Left: Yes] [Right: Yes] Calf Left: Right: Point of Measurement: 26 cm From Medial Instep 40.8 cm 41.9 cm Ankle Left: Right: Point of Measurement: 10 cm From Medial Instep 26.3 cm 26.2 cm Knee To Floor Left: Right: From Medial Instep 37 cm 37 cm Electronic Signature(s) Signed:  02/16/2022 7:03:12 PM By: Dellie Catholic RN Entered By: Dellie Catholic on 02/16/2022 17:28:34 -------------------------------------------------------------------------------- Multi Wound Chart Details Patient Name: Date of Service: Cathy Jenkins, MA [facility] RET C. 02/16/2022 1:15 PM Medical Record Number: [id number] Patient Account Number: [id number] Date of Birth/Sex: Treating RN: 1941-06-04 (81 y.o. Cathy [name] Primary Care Cathy [name]: Donald Prose Other Clinician: Referring Sergey Ishler: Treating Cathy [name]/Extender: Claudia Desanctis Weeks in Treatment: 7 Vital Signs Height(in): 58 Pulse(bpm): 109 Weight(lbs): 138 Blood Pressure(mmHg): 178/91 Body Mass Index(BMI): 28.8 Temperature(F): 97.7 Respiratory Rate(breaths/min): 20 Photos: Left, Anterior Lower Leg Right, Anterior Lower Leg Right, Posterior Lower Leg Wound Location: Gradually Appeared Gradually Appeared Gradually Appeared Wounding Event: Lymphedema Lymphedema Lymphedema Primary Etiology: Lymphedema, Congestive Heart Lymphedema, Congestive Heart Lymphedema, Congestive Heart Comorbid  History: Failure, Hypertension, Osteoarthritis Failure, Hypertension, Osteoarthritis Failure, Hypertension, Osteoarthritis 02/02/2022 02/02/2022 02/02/2022 Date Acquired: '1 1 1 ' Weeks of Treatment: Healed - Epithelialized Open Open Wound Status: No No No Wound Recurrence: Yes Yes Yes Clustered Wound: 0x0x0 0.2x0.2x0.1 1.5x1.6x0.1 Measurements L x W x D (cm) 0 0.031 1.885 A (cm) : rea 0 0.003 0.188 Volume (cm) : 100.00% 98.40% 97.00% % Reduction in Area: 100.00% 98.40% 97.00% % Reduction in Volume: Full Thickness Without Exposed Full Thickness Without Exposed Full Thickness Without Exposed Classification: Support Structures Support Structures Support Structures None Present Large Medium Exudate Amount: N/A Serous Serous Exudate Type: N/A amber amber Exudate Color: None Present (0%) Large (67-100%) Large (67-100%) Granulation Amount: N/A Red Red Granulation Quality: None Present (0%) None Present (0%) None Present (0%) Necrotic Amount: Fascia: No Fascia: No Fat Layer (Subcutaneous Tissue): Yes Exposed Structures: Fat Layer (Subcutaneous Tissue): No Fat Layer (Subcutaneous Tissue): No Fascia: No Tendon: No Tendon: No Tendon: No Muscle: No Muscle: No Muscle: No Joint: No Joint: No Joint: No Bone: No Bone: No Bone: No Large (67-100%) Large (67-100%) None Epithelialization: N/A Compression Therapy N/A Procedures Performed: Treatment Notes Electronic Signature(s) Signed: 02/16/2022 3:26:34 PM By: Fredirick Maudlin MD FACS Signed: 02/16/2022 7:03:12 PM By: Dellie Catholic RN Entered By: Fredirick Maudlin on 02/16/2022 15:26:34 -------------------------------------------------------------------------------- Multi-Disciplinary Care Plan Details Patient Name: Date of Service: [facility], [place] [facility] RET C. 02/16/2022 1:15 PM Medical Record Number: [id number] Patient Account Number: [id number] Date of Birth/Sex: Treating RN: 1941/03/21 (81 y.o. Cathy [name] Primary  Care Denessa Cavan: Donald Prose Other Clinician: Referring Shauntell Iglesia: Treating Esthela Brandner/Extender: Claudia Desanctis Weeks in Treatment: [place] reviewed with physician Active Inactive Pressure Nursing Diagnoses: Knowledge deficit related to management of pressures ulcers Goals: Patient [name] remain free of pressure ulcers Date Initiated: 02/07/2022 Target Resolution Date: 03/24/2022 Goal Status: Active Interventions: Assess potential for pressure ulcer upon admission and as needed Treatment Activities: Patient referred for pressure reduction/relief devices : 02/07/2022 Notes: Electronic Signature(s) Signed: 02/16/2022 7:03:12 PM By: Dellie Catholic RN Entered By: Dellie Catholic on 02/16/2022 17:19:08 -------------------------------------------------------------------------------- Pain Assessment Details Patient Name: Date of Service: Cathy Jenkins, MA [facility] RET C. 02/16/2022 1:15 PM Medical Record Number: [id number] Patient Account Number: [id number] Date of Birth/Sex: Treating RN: 09-11-1941 (81 y.o. Cathy [name] Primary Care Parrish Bonn: Donald Prose Other Clinician: Referring Daxon Kyne: Treating Emilene Roma/Extender: Claudia Desanctis Weeks in Treatment: 7 Active Problems Location of Pain Severity and Description of Pain Patient Has Paino No Site Locations Pain Management and Medication Current Pain Management: Electronic Signature(s) Signed: 02/16/2022 7:03:12 PM By: Dellie Catholic RN Entered By: Dellie Catholic on 02/16/2022 14:49:42 -------------------------------------------------------------------------------- Patient/Caregiver Education Details Patient Name: Date of Service: [name], [place] [facility] RET C. 5/25/2023andnbsp1:15 PM Medical  Record Number: [id number] Patient Account Number: [id number] Date of Birth/Gender: Treating RN: 11-Apr-1941 (81 y.o. Cathy [name] Primary Care Physician: Donald Prose Other Clinician: Referring  Physician: Treating Physician/Extender: Claudia Desanctis Weeks in Treatment: 7 Education Assessment Education Provided To: Patient Education Topics Provided Wound/Skin Impairment: Methods: Explain/Verbal Responses: Return demonstration correctly Electronic Signature(s) Signed: 02/16/2022 7:03:12 PM By: Dellie Catholic RN Entered By: Dellie Catholic on 02/16/2022 17:21:31 -------------------------------------------------------------------------------- Wound Assessment Details Patient Name: Date of Service: Cathy Jenkins, MA [facility] RET C. 02/16/2022 1:15 PM Medical Record Number: [id number] Patient Account Number: [id number] Date of Birth/Sex: Treating RN: 07/05/1941 (81 y.o. Cathy [name] Primary Care Montoya Brandel: Donald Prose Other Clinician: Referring Bashar Milam: Treating Khira Cudmore/Extender: Claudia Desanctis Weeks in Treatment: 7 Wound Status Wound Number: 4 Primary Lymphedema Etiology: Wound Location: Left, Anterior Lower Leg Wound Status: Healed - Epithelialized Wounding Event: Gradually Appeared Comorbid Lymphedema, Congestive Heart Failure, Hypertension, Date Acquired: 02/02/2022 History: Osteoarthritis Weeks Of Treatment: 1 Clustered Wound: Yes Photos Wound Measurements Length: (cm) Width: (cm) Depth: (cm) Area: (cm) Volume: (cm) 0 % Reduction in Area: 100% 0 % Reduction in Volume: 100% 0 Epithelialization: Large (67-100%) 0 Tunneling: No 0 Undermining: No Wound Description Classification: Full Thickness Without Exposed Support Structures Exudate Amount: None Present Foul Odor After Cleansing: No Slough/Fibrino No Wound Bed Granulation Amount: None Present (0%) Exposed Structure Necrotic Amount: None Present (0%) Fascia Exposed: No Fat Layer (Subcutaneous Tissue) Exposed: No Tendon Exposed: No Muscle Exposed: No Joint Exposed: No Bone Exposed: No Electronic Signature(s) Signed: 02/16/2022 7:03:12 PM By: Dellie Catholic RN Entered By:  Dellie Catholic on 02/16/2022 15:07:20 -------------------------------------------------------------------------------- Wound Assessment Details Patient Name: Date of Service: Cathy Jenkins, MA [facility] RET C. 02/16/2022 1:15 PM Medical Record Number: [id number] Patient Account Number: [id number] Date of Birth/Sex: Treating RN: 10-14-1940 (81 y.o. Cathy [name] Primary Care Martita Brumm: Donald Prose Other Clinician: Referring Levis Nazir: Treating Carolos Fecher/Extender: Claudia Desanctis Weeks in Treatment: 7 Wound Status Wound Number: 5 Primary Lymphedema Etiology: Etiology: Wound Location: Right, Anterior Lower Leg Wound Status: Open Wounding Event: Gradually Appeared Comorbid Lymphedema, Congestive Heart Failure, Hypertension, Date Acquired: 02/02/2022 History: Osteoarthritis Weeks Of Treatment: 1 Clustered Wound: Yes Photos Wound Measurements Length: (cm) 0.2 Width: (cm) 0.2 Depth: (cm) 0.1 Area: (cm) 0.031 Volume: (cm) 0.003 % Reduction in Area: 98.4% % Reduction in Volume: 98.4% Epithelialization: Large (67-100%) Tunneling: No Undermining: No Wound Description Classification: Full Thickness Without Exposed Support Structures Exudate Amount: Large Exudate Type: Serous Exudate Color: amber Foul Odor After Cleansing: No Slough/Fibrino No Wound Bed Granulation Amount: Large (67-100%) Exposed Structure Granulation Quality: Red Fascia Exposed: No Necrotic Amount: None Present (0%) Fat Layer (Subcutaneous Tissue) Exposed: No Tendon Exposed: No Muscle Exposed: No Joint Exposed: No Bone Exposed: No Treatment Notes Wound #5 (Lower Leg) Wound Laterality: Right, Anterior Cleanser Soap and Water Discharge Instruction: May shower and wash wound with dial antibacterial soap and water prior to dressing change. Wound Cleanser Discharge Instruction: Cleanse the wound with wound cleanser prior to applying a clean dressing using gauze sponges, not tissue or cotton  balls. Peri-Wound Care Topical Primary Dressing KerraCel Ag Gelling Fiber Dressing, 4x5 in (silver alginate) Discharge Instruction: Apply silver alginate to wound bed as instructed UnnaBoot Bandage 4x10 (in/yd) Discharge Instruction: T top of Bilateral legs o Secondary Dressing ABD Pad, 8x10 Discharge Instruction: Apply over primary dressing as directed. Woven Gauze Sponge, Non-Sterile 4x4 in Discharge Instruction: Apply over primary dressing as directed. Secured With [employer] Surgical Tape, 2x10 (in/yd) Discharge Instruction: Secure dressing  with tape as directed. Compression Wrap ThreePress (3 layer compression wrap) Discharge Instruction: Apply three layer compression as directed. Compression Stockings Add-Ons Electronic Signature(s) Signed: 02/16/2022 7:03:12 PM By: Dellie Catholic RN Entered By: Dellie Catholic on 02/16/2022 14:58:26 -------------------------------------------------------------------------------- Wound Assessment Details Patient Name: Date of Service: Cathy Jenkins, [place] [facility] RET C. 02/16/2022 1:15 PM Medical Record Number: [id number] Patient Account Number: [id number] Date of Birth/Sex: Treating RN: 21-Jan-1941 (81 y.o. Cathy [name] Primary Care Dwana Garin: Donald Prose Other Clinician: Referring Jovani Colquhoun: Treating Sanita Estrada/Extender: Claudia Desanctis Weeks in Treatment: 7 Wound Status Wound Number: 6 Primary Lymphedema Etiology: Wound Location: Right, Posterior Lower Leg Wound Status: Open Wounding Event: Gradually Appeared Comorbid Lymphedema, Congestive Heart Failure, Hypertension, Date Acquired: 02/02/2022 History: Osteoarthritis Weeks Of Treatment: 1 Clustered Wound: Yes Photos Wound Measurements Length: (cm) 1.5 Width: (cm) 1.6 Depth: (cm) 0.1 Area: (cm) 1.885 Volume: (cm) 0.188 % Reduction in Area: 97% % Reduction in Volume: 97% Epithelialization: None Tunneling: No Undermining: No Wound Description Classification: Full  Thickness Without Exposed Support Structures Exudate Amount: Medium Exudate Type: Serous Exudate Color: amber Foul Odor After Cleansing: No Slough/Fibrino No Wound Bed Granulation Amount: Large (67-100%) Exposed Structure Granulation Quality: Red Fascia Exposed: No Necrotic Amount: None Present (0%) Fat Layer (Subcutaneous Tissue) Exposed: Yes Tendon Exposed: No Muscle Exposed: No Joint Exposed: No Bone Exposed: No Treatment Notes Wound #6 (Lower Leg) Wound Laterality: Right, Posterior Cleanser Soap and Water Discharge Instruction: May shower and wash wound with dial antibacterial soap and water prior to dressing change. Wound Cleanser Discharge Instruction: Cleanse the wound with wound cleanser prior to applying a clean dressing using gauze sponges, not tissue or cotton balls. Peri-Wound Care Topical Primary Dressing KerraCel Ag Gelling Fiber Dressing, 4x5 in (silver alginate) Discharge Instruction: Apply silver alginate to wound bed as instructed UnnaBoot Bandage 4x10 (in/yd) Discharge Instruction: T top of Bilateral legs o Secondary Dressing ABD Pad, 8x10 Discharge Instruction: Apply over primary dressing as directed. Woven Gauze Sponge, Non-Sterile 4x4 in Discharge Instruction: Apply over primary dressing as directed. Secured With [employer] Surgical Tape, 2x10 (in/yd) Discharge Instruction: Secure dressing with tape as directed. Compression Wrap ThreePress (3 layer compression wrap) Discharge Instruction: Apply three layer compression as directed. Compression Stockings Add-Ons Electronic Signature(s) Signed: 02/16/2022 7:03:12 PM By: Dellie Catholic RN Entered By: Dellie Catholic on 02/16/2022 14:57:25 -------------------------------------------------------------------------------- Vitals Details Patient Name: Date of Service: Cathy Jenkins, MA [facility] RET C. 02/16/2022 1:15 PM Medical Record Number: [id number] Patient Account Number: [id number] Date of  Birth/Sex: Treating RN: 09-Aug-1941 (81 y.o. Cathy [name] Primary Care Dyquan Minks: Donald Prose Other Clinician: Referring Kimeka Badour: Treating Marquiz Sotelo/Extender: Claudia Desanctis Weeks in Treatment: 7 Vital Signs Time Taken: 14:40 Temperature (F): 97.7 Height (in): 58 Pulse (bpm): 109 Weight (lbs): 138 Respiratory Rate (breaths/min): 20 Body Mass Index (BMI): 28.8 Blood Pressure (mmHg): 178/91 Reference Range: 80 - 120 mg / dl Electronic Signature(s) Signed: 02/16/2022 7:03:12 PM By: Dellie Catholic RN Entered By: Dellie Catholic on 02/16/2022 14:40:51

## 2022-02-17 NOTE — Progress Notes (Signed)
Cathy [name], [name] ([id number]) Visit Report for 02/16/2022 Chief Complaint Document Details Patient Name: Date of Service: Cathy [name], [facility] RET C. 02/16/2022 1:15 PM Medical Record Number: [id number] Patient Account Number: [id number] Date of Birth/Sex: Treating RN: [birth year] (81 y.o. Cathy [name] Primary Care Provider: Donald Prose Other Clinician: Referring Provider: Treating Provider/Extender: Claudia Desanctis Weeks in Treatment: 7 Information Obtained from: Patient Chief Complaint Patient seen for complaints of Non-Healing Wounds. Electronic Signature(s) Signed: 02/16/2022 3:26:41 PM By: Fredirick Maudlin MD FACS Entered By: Fredirick Maudlin on 02/16/2022 15:26:40 -------------------------------------------------------------------------------- HPI Details Patient Name: Date of Service: Cathy Jenkins, MA [facility] RET C. 02/16/2022 1:15 PM Medical Record Number: [id number] Patient Account Number: [id number] Date of Birth/Sex: Treating RN: [birth year] (81 y.o. 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 She is accompanied by her son who helps provide some of the medical history, in addition to review of the electronic medical records. Apparently, starting around Thanksgiving, her legs became progressively more strolling and red. They eventually broke open with multiple weeping ulcers. She was treated for cellulitis with a series of courses of antibiotics including doxycycline Keflex moxifloxacin (which he did not tolerate) and ciprofloxacin. Despite this, her wounds did not improve. Apparently, they were applying hydrogen peroxide to  the wounds. She was referred to the lymphedema clinic at [facility]. Due to the open wounds, no significant intervention could be implemented. She was referred to the wound center in order to address the open wounds. She has what sounds like a very old pair of juxta lite stockings that she does not use; they are likely too old to be effective anymore at this time. She does not wear any other form of compression stocking and she does not have lymphedema pumps. The wounds are on her bilateral lower extremities. The right is nearly circumferential and is covered with heavy crusting and eschar. Some areas are extremely dry and others are moist and have significant slough. On the left, the area is much smaller but also has crusted eschar and slough. There is a fairly strong odor coming from the wounds. The drainage does not appear purulent. They are not particularly tender. 01/04/2022: T oday, her wounds are much improved. The right sided wound has partially healed, separating the previously circumferential wound into anterior and posterior sites. There is significantly less slough and the moisture balance is more appropriate. The wound on the left is nearly closed with just a punctate opening. It is clean without any significant drainage. There is no odor. 01/13/2022: The right sided wound is much smaller and more epithelialized. No slough buildup and edema control is good. The left-sided wound is closed, but she has a new open area just above where her compression wraps were. This appears to be a small blister that has opened. No odor and no significant drainage from any of the wounds. 01/18/2022: There is just a small open area on the posterior calf on the right. The area that opened just above her compression wraps on the left is smaller and nearly closed. 01/30/2022: Her wounds are healed. She did not bring the compression stockings that we ordered for her; she says they were not  received. We do have a photo from  the delivery and she agrees that this is her place of residence. She resides in a retirement community so perhaps somebody else picked them up. 02/07/2022: Unfortunately she has new open wounds on her bilateral lower extremities. She says that the stockings that came with her juxta lites are too tight. The neoprene wraps themselves are fine but the underlying stocking has been causing a lot of friction and irritation to her skin as they are donned and doffed. She has ulcers on both legs, at the left ankle and right calf. They are superficial and limited to breakdown of skin but they are weeping serous fluid. 02/16/2022: The wounds on her left leg have healed. On the right, they are very superficial with just pinpoint openings at this time. We have not yet resolved the issue related to her juxta lite stockings yet. Electronic Signature(s) Signed: 02/16/2022 3:27:24 PM By: Fredirick Maudlin MD FACS Entered By: Fredirick Maudlin on 02/16/2022 15:27:24 -------------------------------------------------------------------------------- Physical Exam Details Patient Name: Date of Service: Cathy Jenkins, MA [facility] RET C. 02/16/2022 1:15 PM Medical Record Number: [id number] Patient Account Number: [id number] Date of Birth/Sex: Treating RN: 1940/12/13 (81 y.o. Cathy [name] Primary Care Provider: Donald Prose Other Clinician: Referring Provider: Treating Provider/Extender: Claudia Desanctis Weeks in Treatment: 7 Constitutional She is hypertensive, but asymptomatic.. Tachycardic, asymptomatic.. . . No acute distress. Respiratory She is audibly wheezing but states that she does not feel short of breath. She says that her allergies have been out of control lately.. Notes 02/16/2022: Her left sided wounds are healed. On the right, the posterior calf wound is very superficial and appears to be partially epithelialized. The right anterior tibial wound is just down to a  pinpoint opening. No slough accumulation. Electronic Signature(s) Signed: 02/16/2022 3:29:06 PM By: Fredirick Maudlin MD FACS Entered By: Fredirick Maudlin on 02/16/2022 15:29:06 -------------------------------------------------------------------------------- Physician Orders Details Patient Name: Date of Service: Cathy Jenkins, MA [facility] RET C. 02/16/2022 1:15 PM Medical Record Number: [id number] Patient Account Number: [id number] Date of Birth/Sex: Treating RN: 1940/11/22 (81 y.o. Cathy [name] Primary Care Provider: Donald Prose Other Clinician: Referring Provider: Treating Provider/Extender: Claudia Desanctis Weeks in Treatment: 7 Verbal / Phone Orders: No Diagnosis Coding ICD-10 Coding Code Description I89.0 Lymphedema, not elsewhere classified R74.08 Chronic diastolic (congestive) heart failure L97.211 Non-pressure chronic ulcer of right calf limited to breakdown of skin Follow-up Appointments ppointment in 1 week. - Dr Celine Ahr Room 3 Return A Bathing/ Shower/ Hygiene Other Bathing/Shower/Hygiene Orders/Instructions: - Please keep both legs dry. Use cast protectors for legs if showering or taking a bath Edema Control - Lymphedema / SCD / Other Bilateral Lower Extremities Avoid standing for long periods of time. Moisturize legs daily. Non Wound Condition Left Lower Extremity Other Non Wound Condition Orders/Instructions: - Compression wrap in 3 layer ( with UNNA at top) Wound Treatment Wound #5 - Lower Leg Wound Laterality: Right, Anterior Cleanser: Soap and Water 1 x Per Week/15 Days Discharge Instructions: May shower and wash wound with dial antibacterial soap and water prior to dressing change. Cleanser: Wound Cleanser 1 x Per Week/15 Days Discharge Instructions: Cleanse the wound with wound cleanser prior to applying a clean dressing using gauze sponges, not tissue or cotton balls. Prim Dressing: KerraCel Ag Gelling Fiber Dressing, 4x5 in (silver alginate) 1 x Per  Week/15 Days ary Discharge Instructions: Apply silver alginate to wound bed as instructed Prim Dressing: UnnaBoot Bandage 4x10 (in/yd) 1 x Per Week/15 Days ary Discharge Instructions: T top of Bilateral legs o Secondary  Dressing: ABD Pad, 8x10 1 x Per Week/15 Days Discharge Instructions: Apply over primary dressing as directed. Secondary Dressing: Woven Gauze Sponge, Non-Sterile 4x4 in 1 x Per Week/15 Days Discharge Instructions: Apply over primary dressing as directed. Secured With: Transpore Surgical Tape, 2x10 (in/yd) 1 x Per Week/15 Days Discharge Instructions: Secure dressing with tape as directed. Compression Wrap: ThreePress (3 layer compression wrap) 1 x Per Week/15 Days Discharge Instructions: Apply three layer compression as directed. Wound #6 - Lower Leg Wound Laterality: Right, Posterior Cleanser: Soap and Water 1 x Per Week/15 Days Discharge Instructions: May shower and wash wound with dial antibacterial soap and water prior to dressing change. Cleanser: Wound Cleanser 1 x Per Week/15 Days Discharge Instructions: Cleanse the wound with wound cleanser prior to applying a clean dressing using gauze sponges, not tissue or cotton balls. Prim Dressing: KerraCel Ag Gelling Fiber Dressing, 4x5 in (silver alginate) 1 x Per Week/15 Days ary Discharge Instructions: Apply silver alginate to wound bed as instructed Prim Dressing: UnnaBoot Bandage 4x10 (in/yd) 1 x Per Week/15 Days ary Discharge Instructions: T top of Bilateral legs o Secondary Dressing: ABD Pad, 8x10 1 x Per Week/15 Days Discharge Instructions: Apply over primary dressing as directed. Secondary Dressing: Woven Gauze Sponge, Non-Sterile 4x4 in 1 x Per Week/15 Days Discharge Instructions: Apply over primary dressing as directed. Secured With: Transpore Surgical Tape, 2x10 (in/yd) 1 x Per Week/15 Days Discharge Instructions: Secure dressing with tape as directed. Compression Wrap: ThreePress (3 layer compression wrap) 1  x Per Week/15 Days Discharge Instructions: Apply three layer compression as directed. Compression Stockings: Jobst Farrow Wrap 4000 (DME) Left Leg Compression Amount: 30-40 mmHG Right Leg Compression Amount: 30-40 mmHG Discharge Instructions: Apply Francia Greaves daily as instructed. Apply first thing in the morning, remove at night before bed. Electronic Signature(s) Signed: 02/16/2022 7:03:12 PM By: Dellie Catholic RN Signed: 02/17/2022 9:16:19 AM By: Fredirick Maudlin MD FACS Previous Signature: 02/16/2022 3:40:58 PM Version By: Fredirick Maudlin MD FACS Entered By: Dellie Catholic on 02/16/2022 18:24:52 -------------------------------------------------------------------------------- Problem List Details Patient Name: Date of Service: Cathy Jenkins, MA [facility] RET C. 02/16/2022 1:15 PM Medical Record Number: [id number] Patient Account Number: [id number] Date of Birth/Sex: Treating RN: Nov 16, 1940 (81 y.o. Cathy [name] Primary Care Provider: Donald Prose Other Clinician: Referring Provider: Treating Provider/Extender: Claudia Desanctis Weeks in Treatment: 7 Active Problems ICD-10 Encounter Code Description Active Date MDM Diagnosis I89.0 Lymphedema, not elsewhere classified 12/28/2021 No Yes J67.34 Chronic diastolic (congestive) heart failure 12/28/2021 No Yes L97.211 Non-pressure chronic ulcer of right calf limited to breakdown of skin 02/07/2022 No Yes Inactive Problems ICD-10 Code Description Active Date Inactive Date L97.212 Non-pressure chronic ulcer of right calf with fat layer exposed 12/28/2021 12/28/2021 L97.221 Non-pressure chronic ulcer of left calf limited to breakdown of skin 01/13/2022 01/13/2022 L97.321 Non-pressure chronic ulcer of left ankle limited to breakdown of skin 12/28/2021 12/28/2021 Resolved Problems Electronic Signature(s) Signed: 02/16/2022 3:26:28 PM By: Fredirick Maudlin MD FACS Entered By: Fredirick Maudlin on 02/16/2022  15:26:28 -------------------------------------------------------------------------------- Progress Note Details Patient Name: Date of Service: Cathy Jenkins, MA [facility] RET C. 02/16/2022 1:15 PM Medical Record Number: [id number] Patient Account Number: [id number] Date of Birth/Sex: Treating RN: Apr 05, 1941 (81 y.o. Cathy [name] Primary Care Provider: Donald Prose Other Clinician: Referring Provider: Treating Provider/Extender: Claudia Desanctis Weeks in Treatment: 7 Subjective Chief Complaint Information obtained from Patient Patient seen for complaints of Non-Healing Wounds. History of Present Illness (HPI) ADMISSION 12/28/2021 This is a 81 year old woman who has been referred  for further evaluation and management of bilateral lower extremity ulcers secondary to uncontrolled lymphedema. She is accompanied by her son who helps provide some of the medical history, in addition to review of the electronic medical records. Apparently, starting around Thanksgiving, her legs became progressively more strolling and red. They eventually broke open with multiple weeping ulcers. She was treated for cellulitis with a series of courses of antibiotics including doxycycline Keflex moxifloxacin (which he did not tolerate) and ciprofloxacin. Despite this, her wounds did not improve. Apparently, they were applying hydrogen peroxide to the wounds. She was referred to the lymphedema clinic at [facility]. Due to the open wounds, no significant intervention could be implemented. She was referred to the wound center in order to address the open wounds. She has what sounds like a very old pair of juxta lite stockings that she does not use; they are likely too old to be effective anymore at this time. She does not wear any other form of compression stocking and she does not have lymphedema pumps. The wounds are on her bilateral lower extremities. The right is nearly circumferential and  is covered with heavy crusting and eschar. Some areas are extremely dry and others are moist and have significant slough. On the left, the area is much smaller but also has crusted eschar and slough. There is a fairly strong odor coming from the wounds. The drainage does not appear purulent. They are not particularly tender. 01/04/2022: T oday, her wounds are much improved. The right sided wound has partially healed, separating the previously circumferential wound into anterior and posterior sites. There is significantly less slough and the moisture balance is more appropriate. The wound on the left is nearly closed with just a punctate opening. It is clean without any significant drainage. There is no odor. 01/13/2022: The right sided wound is much smaller and more epithelialized. No slough buildup and edema control is good. The left-sided wound is closed, but she has a new open area just above where her compression wraps were. This appears to be a small blister that has opened. No odor and no significant drainage from any of the wounds. 01/18/2022: There is just a small open area on the posterior calf on the right. The area that opened just above her compression wraps on the left is smaller and nearly closed. 01/30/2022: Her wounds are healed. She did not bring the compression stockings that we ordered for her; she says they were not received. We do have a photo from the delivery and she agrees that this is her place of residence. She resides in a retirement community so perhaps somebody else picked them up. 02/07/2022: Unfortunately she has new open wounds on her bilateral lower extremities. She says that the stockings that came with her juxta lites are too tight. The neoprene wraps themselves are fine but the underlying stocking has been causing a lot of friction and irritation to her skin as they are donned and doffed. She has ulcers on both legs, at the left ankle and right calf. They are superficial  and limited to breakdown of skin but they are weeping serous fluid. 02/16/2022: The wounds on her left leg have healed. On the right, they are very superficial with just pinpoint openings at this time. We have not yet resolved the issue related to her juxta lite stockings yet. Patient History Information obtained from Patient, Caregiver. Family History Unknown History. Social History Former smoker - quit in 1980s, Alcohol Use - Never,  Drug Use - No History, Caffeine Use - Rarely. Medical History Hematologic/Lymphatic Patient has history of Lymphedema - lower legs Cardiovascular Patient has history of Congestive Heart Failure, Hypertension Musculoskeletal Patient has history of Osteoarthritis Objective Constitutional She is hypertensive, but asymptomatic.. Tachycardic, asymptomatic.Marland Kitchen No acute distress. Vitals Time Taken: 2:40 PM, Height: 58 in, Weight: 138 lbs, BMI: 28.8, Temperature: 97.7 F, Pulse: 109 bpm, Respiratory Rate: 20 breaths/min, Blood Pressure: 178/91 mmHg. Respiratory She is audibly wheezing but states that she does not feel short of breath. She says that her allergies have been out of control lately.. General Notes: 02/16/2022: Her left sided wounds are healed. On the right, the posterior calf wound is very superficial and appears to be partially epithelialized. The right anterior tibial wound is just down to a pinpoint opening. No slough accumulation. Integumentary (Hair, Skin) Wound #4 status is Healed - Epithelialized. Original cause of wound was Gradually Appeared. The date acquired was: 02/02/2022. The wound has been in treatment 1 weeks. The wound is located on the Left,Anterior Lower Leg. The wound measures 0cm length x 0cm width x 0cm depth; 0cm^2 area and 0cm^3 volume. There is no tunneling or undermining noted. There is a none present amount of drainage noted. There is no granulation within the wound bed. There is no necrotic tissue within the wound bed. Wound #5  status is Open. Original cause of wound was Gradually Appeared. The date acquired was: 02/02/2022. The wound has been in treatment 1 weeks. The wound is located on the Right,Anterior Lower Leg. The wound measures 0.2cm length x 0.2cm width x 0.1cm depth; 0.031cm^2 area and 0.003cm^3 volume. There is no tunneling or undermining noted. There is a large amount of serous drainage noted. There is large (67-100%) red granulation within the wound bed. There is no necrotic tissue within the wound bed. Wound #6 status is Open. Original cause of wound was Gradually Appeared. The date acquired was: 02/02/2022. The wound has been in treatment 1 weeks. The wound is located on the Right,Posterior Lower Leg. The wound measures 1.5cm length x 1.6cm width x 0.1cm depth; 1.885cm^2 area and 0.188cm^3 volume. There is Fat Layer (Subcutaneous Tissue) exposed. There is no tunneling or undermining noted. There is a medium amount of serous drainage noted. There is large (67-100%) red granulation within the wound bed. There is no necrotic tissue within the wound bed. Assessment Active Problems ICD-10 Lymphedema, not elsewhere classified Chronic diastolic (congestive) heart failure Non-pressure chronic ulcer of right calf limited to breakdown of skin Procedures Wound #5 Pre-procedure diagnosis of Wound #5 is a Lymphedema located on the Right,Anterior Lower Leg . There was a Three Layer Compression Therapy Procedure by Dellie Catholic, RN. Post procedure Diagnosis Wound #5: Same as Pre-Procedure There was a Three Layer Compression Therapy Procedure by Dellie Catholic, RN. Post procedure Diagnosis Wound #: Same as Pre-Procedure Plan Follow-up Appointments: Return Appointment in 1 week. - Dr Celine Ahr Room 3 Bathing/ Shower/ Hygiene: Other Bathing/Shower/Hygiene Orders/Instructions: - Please keep both legs dry. Use cast protectors for legs if showering or taking a bath Edema Control - Lymphedema / SCD / Other: Avoid  standing for long periods of time. Moisturize legs daily. Non Wound Condition: Other Non Wound Condition Orders/Instructions: - Compression wrap in 3 layer ( with UNNA at top) WOUND #5: - Lower Leg Wound Laterality: Right, Anterior Cleanser: Soap and Water 1 x Per Week/15 Days Discharge Instructions: May shower and wash wound with dial antibacterial soap and water prior to dressing change. Cleanser: Wound Cleanser  1 x Per Week/15 Days Discharge Instructions: Cleanse the wound with wound cleanser prior to applying a clean dressing using gauze sponges, not tissue or cotton balls. Prim Dressing: KerraCel Ag Gelling Fiber Dressing, 4x5 in (silver alginate) 1 x Per Week/15 Days ary Discharge Instructions: Apply silver alginate to wound bed as instructed Prim Dressing: UnnaBoot Bandage 4x10 (in/yd) 1 x Per Week/15 Days ary Discharge Instructions: T top of Bilateral legs o Secondary Dressing: ABD Pad, 8x10 1 x Per Week/15 Days Discharge Instructions: Apply over primary dressing as directed. Secondary Dressing: Woven Gauze Sponge, Non-Sterile 4x4 in 1 x Per Week/15 Days Discharge Instructions: Apply over primary dressing as directed. Secured With: Transpore Surgical T ape, 2x10 (in/yd) 1 x Per Week/15 Days Discharge Instructions: Secure dressing with tape as directed. Com pression Wrap: ThreePress (3 layer compression wrap) 1 x Per Week/15 Days Discharge Instructions: Apply three layer compression as directed. WOUND #6: - Lower Leg Wound Laterality: Right, Posterior Cleanser: Soap and Water 1 x Per Week/15 Days Discharge Instructions: May shower and wash wound with dial antibacterial soap and water prior to dressing change. Cleanser: Wound Cleanser 1 x Per Week/15 Days Discharge Instructions: Cleanse the wound with wound cleanser prior to applying a clean dressing using gauze sponges, not tissue or cotton balls. Prim Dressing: KerraCel Ag Gelling Fiber Dressing, 4x5 in (silver alginate) 1 x Per  Week/15 Days ary Discharge Instructions: Apply silver alginate to wound bed as instructed Prim Dressing: UnnaBoot Bandage 4x10 (in/yd) 1 x Per Week/15 Days ary Discharge Instructions: T top of Bilateral legs o Secondary Dressing: ABD Pad, 8x10 1 x Per Week/15 Days Discharge Instructions: Apply over primary dressing as directed. Secondary Dressing: Woven Gauze Sponge, Non-Sterile 4x4 in 1 x Per Week/15 Days Discharge Instructions: Apply over primary dressing as directed. Secured With: Transpore Surgical T ape, 2x10 (in/yd) 1 x Per Week/15 Days Discharge Instructions: Secure dressing with tape as directed. Com pression Wrap: ThreePress (3 layer compression wrap) 1 x Per Week/15 Days Discharge Instructions: Apply three layer compression as directed. 02/16/2022: Her left sided wounds are healed. On the right, the posterior calf wound is very superficial and appears to be partially epithelialized. The right anterior tibial wound is just down to a pinpoint opening. No slough accumulation. No debridement was necessary today. Because we still do not have an adequate solution for her lower extremity compression, I am going to put her back in bilateral wraps even though her left leg is healed. We will continue using silver alginate to the open wounds on the right. Follow-up in 1 week. Electronic Signature(s) Signed: 02/16/2022 3:30:30 PM By: Fredirick Maudlin MD FACS Entered By: Fredirick Maudlin on 02/16/2022 15:30:30 -------------------------------------------------------------------------------- HxROS Details Patient Name: Date of Service: Cathy Jenkins, MA [facility] RET C. 02/16/2022 1:15 PM Medical Record Number: [id number] Patient Account Number: [id number] Date of Birth/Sex: Treating RN: 1940-12-13 (81 y.o. Cathy [name] Primary Care Provider: Donald Prose Other Clinician: Referring Provider: Treating Provider/Extender: Claudia Desanctis Weeks in Treatment: 7 Information Obtained  From Patient Caregiver Hematologic/Lymphatic Medical History: Positive for: Lymphedema - lower legs Cardiovascular Medical History: Positive for: Congestive Heart Failure; Hypertension Musculoskeletal Medical History: Positive for: Osteoarthritis Immunizations Pneumococcal Vaccine: Received Pneumococcal Vaccination: Yes Received Pneumococcal Vaccination On or After 60th Birthday: No Implantable Devices None Family and Social History Unknown History: Yes; Former smoker - quit in 1980s; Alcohol Use: Never; Drug Use: No History; Caffeine Use: Rarely; Financial Concerns: No; Food, Clothing or Shelter Needs: No; Support System Lacking: No; Transportation Concerns:  No Electronic Signature(s) Signed: 02/16/2022 3:40:58 PM By: Fredirick Maudlin MD FACS Signed: 02/16/2022 7:03:12 PM By: Dellie Catholic RN Entered By: Fredirick Maudlin on 02/16/2022 15:27:49 -------------------------------------------------------------------------------- SuperBill Details Patient Name: Date of Service: Cathy Jenkins, MA [facility] RET C. 02/16/2022 Medical Record Number: [id number] Patient Account Number: [id number] Date of Birth/Sex: Treating RN: May 20, 1941 (81 y.o. 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

## 2022-02-21 ENCOUNTER — Encounter: Payer: Medicare Other | Admitting: Occupational Therapy

## 2022-02-22 ENCOUNTER — Encounter: Payer: Medicare Other | Admitting: Occupational Therapy

## 2022-02-23 ENCOUNTER — Encounter (HOSPITAL_BASED_OUTPATIENT_CLINIC_OR_DEPARTMENT_OTHER): Payer: Medicare Other | Attending: General Surgery | Admitting: General Surgery

## 2022-02-23 DIAGNOSIS — I890 Lymphedema, not elsewhere classified: Secondary | ICD-10-CM | POA: Insufficient documentation

## 2022-02-23 DIAGNOSIS — I110 Hypertensive heart disease with heart failure: Secondary | ICD-10-CM | POA: Insufficient documentation

## 2022-02-23 DIAGNOSIS — L97211 Non-pressure chronic ulcer of right calf limited to breakdown of skin: Secondary | ICD-10-CM | POA: Diagnosis not present

## 2022-02-23 DIAGNOSIS — M1990 Unspecified osteoarthritis, unspecified site: Secondary | ICD-10-CM | POA: Insufficient documentation

## 2022-02-23 DIAGNOSIS — I5032 Chronic diastolic (congestive) heart failure: Secondary | ICD-10-CM | POA: Insufficient documentation

## 2022-02-23 DIAGNOSIS — Z87891 Personal history of nicotine dependence: Secondary | ICD-10-CM | POA: Insufficient documentation

## 2022-02-24 ENCOUNTER — Encounter: Payer: Medicare Other | Admitting: Occupational Therapy

## 2022-02-24 NOTE — Progress Notes (Signed)
Cathy [name], Cathy [name] ([id number]) Visit Report for 02/23/2022 Arrival Information Details Patient Name: Date of Service: Cathy [name], Cathy [name] [facility] RET C. 02/23/2022 3:15 PM Medical Record Number: [id number] Patient Account Number: [id number] Date of Birth/Sex: Treating RN: August 05, 1941 (81 y.o. Cathy [name] Primary Care Gwendolyne Welford: Donald Prose Other Clinician: Referring Kiowa Peifer: Treating Colan Laymon/Extender: Claudia Desanctis Weeks in Treatment: 8 Visit Information History Since Last Visit Added or deleted any medications: No Patient Arrived: Stretcher Any new allergies or adverse reactions: No Arrival Time: 15:56 Had a fall or experienced change in No Accompanied By: self activities of daily living that may affect Transfer Assistance: None risk of falls: Patient Identification Verified: Yes Signs or symptoms of abuse/neglect since last visito No Secondary Verification Process Completed: Yes Hospitalized since last visit: No Patient Requires Transmission-Based Precautions: No Implantable device outside of the clinic excluding No Patient Has Alerts: No cellular tissue based products placed in the center since last visit: Has Dressing in Place as Prescribed: Yes Has Compression in Place as Prescribed: Yes Pain Present Now: No Electronic Signature(s) Signed: 02/23/2022 5:16:25 PM By: Adline Peals Entered By: Adline Peals on 02/23/2022 15:58:15 -------------------------------------------------------------------------------- Clinic Level of Care Assessment Details Patient Name: Date of Service: [facility], Cathy [name] RGA RET C. 02/23/2022 3:15 PM Medical Record Number: [id number] Patient Account Number: [id number] Date of Birth/Sex: Treating RN: 09-11-1941 (81 y.o. Cathy [name] Primary Care Cornie Herrington: Donald Prose Other Clinician: Referring Elmore Hyslop: Treating Lemmie Steinhaus/Extender: Claudia Desanctis Weeks in Treatment: 8 Clinic Level of Care Assessment Items TOOL 4  Quantity Score X- 1 0 Use when only an EandM is performed on FOLLOW-UP visit ASSESSMENTS - Nursing Assessment / Reassessment X- 1 10 Reassessment of Co-morbidities (includes updates in patient status) X- 1 5 Reassessment of Adherence to Treatment Plan ASSESSMENTS - Wound and Skin A ssessment / Reassessment [] - 0 Simple Wound Assessment / Reassessment - one wound X- 2 5 Complex Wound Assessment / Reassessment - multiple wounds [] - 0 Dermatologic / Skin Assessment (not related to wound area) ASSESSMENTS - Focused Assessment [] - 0 Circumferential Edema Measurements - multi extremities [] - 0 Nutritional Assessment / Counseling / Intervention [] - 0 Lower Extremity Assessment (monofilament, tuning [name], pulses) [] - 0 Peripheral Arterial Disease Assessment (using hand held doppler) ASSESSMENTS - Ostomy and/or Continence Assessment and Care [] - 0 Incontinence Assessment and Management [] - 0 Ostomy Care Assessment and Management (repouching, etc.) PROCESS - Coordination of Care [] - 0 Simple Patient / Family Education for ongoing care X- 1 20 Complex (extensive) Patient / Family Education for ongoing care X- 1 10 Staff obtains Programmer, systems, Records, T Results / Process Orders est X- 1 10 Staff telephones HHA, Nursing Homes / Clarify orders / etc [] - 0 Routine Transfer to another Facility (non-emergent condition) [] - 0 Routine Hospital Admission (non-emergent condition) [] - 0 New Admissions / Biomedical engineer / Ordering NPWT Apligraf, etc. , [] - 0 Emergency Hospital Admission (emergent condition) [] - 0 Simple Discharge Coordination X- 1 15 Complex (extensive) Discharge Coordination PROCESS - Special Needs [] - 0 Pediatric / Minor Patient Management [] - 0 Isolation Patient Management [] - 0 Hearing / Language / Visual special needs [] - 0 Assessment of Community assistance (transportation, D/C planning, etc.) [] - 0 Additional assistance / Altered  mentation [] - 0 Support Surface(s) Assessment (bed, cushion, seat, etc.) INTERVENTIONS - Wound Cleansing / Measurement [] - 0 Simple Wound Cleansing - one wound X- 2 5  Complex Wound Cleansing - multiple wounds X- 1 5 Wound Imaging (photographs - any number of wounds) [] - 0 Wound Tracing (instead of photographs) [] - 0 Simple Wound Measurement - one wound [] - 0 Complex Wound Measurement - multiple wounds INTERVENTIONS - Wound Dressings [] - 0 Small Wound Dressing one or multiple wounds [] - 0 Medium Wound Dressing one or multiple wounds [] - 0 Large Wound Dressing one or multiple wounds [] - 0 Application of Medications - topical [] - 0 Application of Medications - injection INTERVENTIONS - Miscellaneous [] - 0 External ear exam [] - 0 Specimen Collection (cultures, biopsies, blood, body fluids, etc.) [] - 0 Specimen(s) / Culture(s) sent or taken to Lab for analysis [] - 0 Patient Transfer (multiple staff / Civil Service fast streamer / Similar devices) [] - 0 Simple Staple / Suture removal (25 or less) [] - 0 Complex Staple / Suture removal (26 or more) [] - 0 Hypo / Hyperglycemic Management (close monitor of Blood Glucose) [] - 0 Ankle / Brachial Index (ABI) - do not check if billed separately X- 1 5 Vital Signs Has the patient been seen at the hospital within the last three years: Yes Total Score: 100 Level Of Care: New/Established - Level 3 Electronic Signature(s) Signed: 02/23/2022 6:53:41 PM By: Dellie Catholic RN Entered By: Dellie Catholic on 02/23/2022 17:52:31 -------------------------------------------------------------------------------- Encounter Discharge Information Details Patient Name: Date of Service: Cathy Jenkins, Cathy [name] [facility] RET C. 02/23/2022 3:15 PM Medical Record Number: [id number] Patient Account Number: [id number] Date of Birth/Sex: Treating RN: 1941-06-10 (81 y.o. Cathy [name] Primary Care Aarian Griffie: Donald Prose Other Clinician: Referring Stryker Veasey: Treating  Patrica Mendell/Extender: Claudia Desanctis Weeks in Treatment: 8 Encounter Discharge Information Items Discharge Condition: Stable Ambulatory Status: Wheelchair Discharge Destination: Home Transportation: Private Auto Accompanied By: son Schedule Follow-up Appointment: Yes Clinical Summary of Care: Patient Declined Electronic Signature(s) Signed: 02/23/2022 6:53:41 PM By: Dellie Catholic RN Entered By: Dellie Catholic on 02/23/2022 17:53:21 -------------------------------------------------------------------------------- Lower Extremity Assessment Details Patient Name: Date of Service: [facility], Cathy [name] RGA RET C. 02/23/2022 3:15 PM Medical Record Number: [id number] Patient Account Number: [id number] Date of Birth/Sex: Treating RN: [birth year] (81 y.o. Cathy [name] Primary Care Emmalea Treanor: Donald Prose Other Clinician: Referring Claus Silvestro: Treating Maecy Podgurski/Extender: Claudia Desanctis Weeks in Treatment: 8 Edema Assessment Assessed: [Left: No] [Right: No] Edema: [Left: Yes] [Right: Yes] Calf Left: Right: Point of Measurement: 23 cm From Medial Instep 36.5 cm 34.2 cm Ankle Left: Right: Point of Measurement: 10 cm From Medial Instep 24.5 cm 23.5 cm Knee To Floor Left: Right: From Medial Instep 35 cm 35 cm Vascular Assessment Pulses: Dorsalis Pedis Palpable: [Left:Yes] [Right:Yes] Electronic Signature(s) Signed: 02/23/2022 5:16:25 PM By: Adline Peals Signed: 02/23/2022 6:53:41 PM By: Dellie Catholic RN Entered By: Dellie Catholic on 02/23/2022 16:20:40 -------------------------------------------------------------------------------- Multi Wound Chart Details Patient Name: Date of Service: Cathy Jenkins, Cathy [name] [facility] RET C. 02/23/2022 3:15 PM Medical Record Number: [id number] Patient Account Number: [id number] Date of Birth/Sex: Treating RN: 1941-07-01 (81 y.o. Cathy [name] Primary Care Eustacio Ellen: Donald Prose Other Clinician: Referring Rosealyn Little: Treating  Kein Carlberg/Extender: Claudia Desanctis Weeks in Treatment: 8 Vital Signs Height(in): 58 Pulse(bpm): 91 Weight(lbs): 138 Blood Pressure(mmHg): 176/96 Body Mass Index(BMI): 28.8 Temperature(F): 98.1 Respiratory Rate(breaths/min): 20 Photos: [N/A:N/A] Right, Anterior Lower Leg Right, Posterior Lower Leg N/A Wound Location: Gradually Appeared Gradually Appeared N/A Wounding Event: Lymphedema Lymphedema N/A Primary Etiology: Lymphedema, Congestive Heart Lymphedema, Congestive Heart N/A Comorbid History: Failure, Hypertension, Osteoarthritis Failure, Hypertension, Osteoarthritis 02/02/2022  02/02/2022 N/A Date Acquired: 2 2 N/A Weeks of Treatment: Open Open N/A Wound Status: No No N/A Wound Recurrence: Yes Yes N/A Clustered Wound: 0.1x0.1x0.1 0.1x0.1x0.1 N/A Measurements L x W x D (cm) 0.008 0.008 N/A A (cm) : rea 0.001 0.001 N/A Volume (cm) : 99.60% 100.00% N/A % Reduction in Area: 99.50% 100.00% N/A % Reduction in Volume: Full Thickness Without Exposed Full Thickness Without Exposed N/A Classification: Support Structures Support Structures None Present None Present N/A Exudate Amount: Distinct, outline attached N/A N/A Wound Margin: Large (67-100%) Large (67-100%) N/A Granulation Amount: Red Red N/A Granulation Quality: None Present (0%) None Present (0%) N/A Necrotic Amount: Fat Layer (Subcutaneous Tissue): Yes Fat Layer (Subcutaneous Tissue): Yes N/A Exposed Structures: Fascia: No Fascia: No Tendon: No Tendon: No Muscle: No Muscle: No Joint: No Joint: No Bone: No Bone: No Large (67-100%) Large (67-100%) N/A Epithelialization: Treatment Notes Electronic Signature(s) Signed: 02/23/2022 4:37:26 PM By: Fredirick Maudlin MD FACS Signed: 02/23/2022 6:53:41 PM By: Dellie Catholic RN Entered By: Fredirick Maudlin on 02/23/2022 16:37:26 -------------------------------------------------------------------------------- Multi-Disciplinary Care Plan  Details Patient Name: Date of Service: Cathy Jenkins, Cathy [name] [facility] RET C. 02/23/2022 3:15 PM Medical Record Number: [id number] Patient Account Number: [id number] Date of Birth/Sex: Treating RN: [birth year] (81 y.o. Cathy [name] Primary Care Provider: Donald Prose Other Clinician: Referring Provider: Treating Provider/Extender: Claudia Desanctis Weeks in Treatment: [place] reviewed with physician Active Inactive Electronic Signature(s) Signed: 02/23/2022 6:53:41 PM By: Dellie Catholic RN Signed: 02/24/2022 5:10:34 PM By: Adline Peals Previous Signature: 02/23/2022 5:16:25 PM Version By: Adline Peals Entered By: Dellie Catholic on 02/23/2022 17:54:35 -------------------------------------------------------------------------------- Pain Assessment Details Patient Name: Date of Service: Cathy Jenkins, Cathy [name] [facility] RET C. 02/23/2022 3:15 PM Medical Record Number: [id number] Patient Account Number: [id number] Date of Birth/Sex: Treating RN: Jan 23, 1941 (81 y.o. Cathy [name] Primary Care Provider: Donald Prose Other Clinician: Referring Provider: Treating Provider/Extender: Claudia Desanctis Weeks in Treatment: 8 Active Problems Location of Pain Severity and Description of Pain Patient Has Paino No Site Locations Rate the pain. Rate the pain. Current Pain Level: 0 Pain Management and Medication Current Pain Management: Electronic Signature(s) Signed: 02/23/2022 5:16:25 PM By: Adline Peals Entered By: Adline Peals on 02/23/2022 15:58:25 -------------------------------------------------------------------------------- Patient/Caregiver Education Details Patient Name: Date of Service: Cathy Jenkins, Cathy [name] [facility] RET C. 6/1/2023andnbsp3:15 PM Medical Record Number: [id number] Patient Account Number: [id number] Date of Birth/Gender: Treating RN: Jan 29, 1941 (81 y.o. Cathy [name] Primary Care Physician: Donald Prose Other  Clinician: Referring Physician: Treating Physician/Extender: Claudia Desanctis Weeks in Treatment: 8 Education Assessment Education Provided To: Patient Education Topics Provided Wound/Skin Impairment: Methods: Explain/Verbal Responses: Reinforcements needed, State content correctly Electronic Signature(s) Signed: 02/23/2022 5:16:25 PM By: Adline Peals Entered By: Adline Peals on 02/23/2022 16:12:52 -------------------------------------------------------------------------------- Wound Assessment Details Patient Name: Date of Service: Cathy Jenkins, Cathy [name] [facility] RET C. 02/23/2022 3:15 PM Medical Record Number: [id number] Patient Account Number: [id number] Date of Birth/Sex: Treating RN: 1941-02-01 (81 y.o. Cathy [name] Primary Care Provider: Donald Prose Other Clinician: Referring Provider: Treating Provider/Extender: Claudia Desanctis Weeks in Treatment: 8 Wound Status Wound Number: 5 Primary Lymphedema Etiology: Wound Location: Right, Anterior Lower Leg Wound Status: Healed - Epithelialized Wounding Event: Gradually Appeared Comorbid Lymphedema, Congestive Heart Failure, Hypertension, Date Acquired: 02/02/2022 History: Osteoarthritis Weeks Of Treatment: 2 Clustered Wound: Yes Photos Wound Measurements Length: (cm) Width: (cm) Depth: (cm) Area: (cm) Volume: (cm) 0 % Reduction in Area: 100% 0 % Reduction in Volume: 100% 0 Epithelialization: Large (67-100%) 0  Tunneling: No 0 Undermining: No Wound Description Classification: Full Thickness Without Exposed Support Structures Wound Margin: Distinct, outline attached Exudate Amount: None Present Foul Odor After Cleansing: No Slough/Fibrino No Wound Bed Granulation Amount: None Present (0%) Exposed Structure Necrotic Amount: None Present (0%) Fascia Exposed: No Fat Layer (Subcutaneous Tissue) Exposed: No Tendon Exposed: No Muscle Exposed: No Joint Exposed: No Bone Exposed:  No Electronic Signature(s) Signed: 02/23/2022 5:16:25 PM By: Adline Peals Signed: 02/23/2022 6:53:41 PM By: Dellie Catholic RN Entered By: Dellie Catholic on 02/23/2022 16:40:22 -------------------------------------------------------------------------------- Wound Assessment Details Patient Name: Date of Service: Cathy Jenkins, Cathy [name] [facility] RET C. 02/23/2022 3:15 PM Medical Record Number: [id number] Patient Account Number: [id number] Date of Birth/Sex: Treating RN: [birth year] (81 y.o. Cathy [name] Primary Care Neftali Abair: Donald Prose Other Clinician: Referring Joanny Dupree: Treating Mckenzey Parcell/Extender: Claudia Desanctis Weeks in Treatment: 8 Wound Status Wound Number: 6 Primary Lymphedema Etiology: Wound Location: Right, Posterior Lower Leg Wound Status: Healed - Epithelialized Wounding Event: Gradually Appeared Comorbid Lymphedema, Congestive Heart Failure, Hypertension, Date Acquired: 02/02/2022 History: Osteoarthritis Weeks Of Treatment: 2 Clustered Wound: Yes Photos Wound Measurements Length: (cm) Width: (cm) Depth: (cm) Area: (cm) Volume: (cm) 0 % Reduction in Area: 100% 0 % Reduction in Volume: 100% 0 Epithelialization: Large (67-100%) 0 Tunneling: No 0 Undermining: No Wound Description Classification: Full Thickness Without Exposed Support Structures Exudate Amount: None Present Foul Odor After Cleansing: No Slough/Fibrino No Wound Bed Granulation Amount: Large (67-100%) Exposed Structure Granulation Quality: Red Fascia Exposed: No Necrotic Amount: None Present (0%) Fat Layer (Subcutaneous Tissue) Exposed: No Tendon Exposed: No Muscle Exposed: No Joint Exposed: No Bone Exposed: No Electronic Signature(s) Signed: 02/23/2022 5:16:25 PM By: Adline Peals Signed: 02/23/2022 6:53:41 PM By: Dellie Catholic RN Entered By: Dellie Catholic on 02/23/2022 16:39:50 -------------------------------------------------------------------------------- Vitals  Details Patient Name: Date of Service: Cathy Jenkins, Cathy [name] [facility] RET C. 02/23/2022 3:15 PM Medical Record Number: [id number] Patient Account Number: [id number] Date of Birth/Sex: Treating RN: [birth year] (81 y.o. Cathy [name] Primary Care Harve Spradley: Donald Prose Other Clinician: Referring Marnesha Gagen: Treating Cailan General/Extender: Claudia Desanctis Weeks in Treatment: 8 Vital Signs Time Taken: 16:00 Temperature (F): 98.1 Height (in): 58 Pulse (bpm): 91 Weight (lbs): 138 Respiratory Rate (breaths/min): 20 Body Mass Index (BMI): 28.8 Blood Pressure (mmHg): 176/96 Reference Range: 80 - 120 mg / dl Electronic Signature(s) Signed: 02/23/2022 5:16:25 PM By: Adline Peals Entered By: Adline Peals on 02/23/2022 16:01:02

## 2022-02-24 NOTE — Progress Notes (Signed)
Cathy [name], Cathy [name] ([id number]) Visit Report for 02/23/2022 Chief Complaint Document Details Patient Name: Date of Service: [place], [place] [facility] C. 02/23/2022 3:15 PM Medical Record Number: [id number] Patient Account Number: [id number] Date of Birth/Sex: Treating RN: 18-Aug-1941 (81 y.o. Cathy [name] Primary Care Provider: Donald Prose Other Clinician: Referring Provider: Treating Provider/Extender: Claudia Desanctis Weeks in Treatment: 8 Information Obtained from: Patient Chief Complaint Patient seen for complaints of Non-Healing Wounds. Electronic Signature(s) Signed: 02/23/2022 4:37:34 PM By: Fredirick Maudlin MD FACS Entered By: Fredirick Maudlin on 02/23/2022 16:37:34 -------------------------------------------------------------------------------- HPI Details Patient Name: Date of Service: Cathy Jenkins, MA [facility] C. 02/23/2022 3:15 PM Medical Record Number: [id number] Patient Account Number: [id number] Date of Birth/Sex: Treating RN: 05/24/1941 (81 y.o. 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 She is accompanied by her son who helps provide some of the medical history, in addition to review of the electronic medical records. Apparently, starting around Thanksgiving, her legs became progressively more strolling and red. They eventually broke open with multiple weeping ulcers. She was treated for cellulitis with a series of courses of antibiotics including doxycycline Keflex moxifloxacin (which he did not tolerate) and ciprofloxacin. Despite this, her wounds did not improve. Apparently, they were applying hydrogen peroxide to the  wounds. She was referred to the lymphedema clinic at [facility]. Due to the open wounds, no significant intervention could be implemented. She was referred to the wound center in order to address the open wounds. She has what sounds like a very old pair of juxta lite stockings that she does not use; they are likely too old to be effective anymore at this time. She does not wear any other form of compression stocking and she does not have lymphedema pumps. The wounds are on her bilateral lower extremities. The right is nearly circumferential and is covered with heavy crusting and eschar. Some areas are extremely dry and others are moist and have significant slough. On the left, the area is much smaller but also has crusted eschar and slough. There is a fairly strong odor coming from the wounds. The drainage does not appear purulent. They are not particularly tender. 01/04/2022: T oday, her wounds are much improved. The right sided wound has partially healed, separating the previously circumferential wound into anterior and posterior sites. There is significantly less slough and the moisture balance is more appropriate. The wound on the left is nearly closed with just a punctate opening. It is clean without any significant drainage. There is no odor. 01/13/2022: The right sided wound is much smaller and more epithelialized. No slough buildup and edema control is good. The left-sided wound is closed, but she has a new open area just above where her compression wraps were. This appears to be a small blister that has opened. No odor and no significant drainage from any of the wounds. 01/18/2022: There is just a small open area on the posterior calf on the right. The area that opened just above her compression wraps on the left is smaller and nearly closed. 01/30/2022: Her wounds are healed. She did not bring the compression stockings that we ordered for her; she says they were not  received. We do have a photo from  the delivery and she agrees that this is her place of residence. She resides in a retirement community so perhaps somebody else picked them up. 02/07/2022: Unfortunately she has new open wounds on her bilateral lower extremities. She says that the stockings that came with her juxta lites are too tight. The neoprene wraps themselves are fine but the underlying stocking has been causing a lot of friction and irritation to her skin as they are donned and doffed. She has ulcers on both legs, at the left ankle and right calf. They are superficial and limited to breakdown of skin but they are weeping serous fluid. 02/16/2022: The wounds on her left leg have healed. On the right, they are very superficial with just pinpoint openings at this time. We have not yet resolved the issue related to her juxta lite stockings yet. 02/23/2022: All of her wounds are healed. We will not be able to get her a larger size of juxta lite stockings until after June 5 due to insurance coverage issues. Electronic Signature(s) Signed: 02/23/2022 4:38:17 PM By: Fredirick Maudlin MD FACS Entered By: Fredirick Maudlin on 02/23/2022 16:38:17 -------------------------------------------------------------------------------- Physical Exam Details Patient Name: Date of Service: Cathy Jenkins, MA [facility] RET C. 02/23/2022 3:15 PM Medical Record Number: [id number] Patient Account Number: [id number] Date of Birth/Sex: Treating RN: 14-Jan-1941 (81 y.o. Cathy [name] Primary Care Provider: Donald Prose Other Clinician: Referring Provider: Treating Provider/Extender: Claudia Desanctis Weeks in Treatment: 8 Constitutional She is hypertensive, but asymptomatic.. . . . No acute distress. Respiratory Normal work of breathing on room air. Notes 02/23/2022: All of her wounds have healed. Electronic Signature(s) Signed: 02/23/2022 4:39:43 PM By: Fredirick Maudlin MD FACS Entered By: Fredirick Maudlin on 02/23/2022  16:39:43 -------------------------------------------------------------------------------- Physician Orders Details Patient Name: Date of Service: Cathy Jenkins, MA [facility] RET C. 02/23/2022 3:15 PM Medical Record Number: [id number] Patient Account Number: [id number] Date of Birth/Sex: Treating RN: 1940-10-21 (81 y.o. Cathy [name] Primary Care Provider: Donald Prose Other Clinician: Referring Provider: Treating Provider/Extender: Claudia Desanctis Weeks in Treatment: 8 Verbal / Phone Orders: No Diagnosis Coding Follow-up Appointments ppointment in 1 week. - Dr Celine Ahr Room 3 Return A Bathing/ Shower/ Hygiene Other Bathing/Shower/Hygiene Orders/Instructions: - Please keep both legs dry. Use cast protectors for legs if showering or taking a bath Edema Control - Lymphedema / SCD / Other Bilateral Lower Extremities Avoid standing for long periods of time. Moisturize legs daily. Electronic Signature(s) Signed: 02/23/2022 4:49:27 PM By: Fredirick Maudlin MD FACS Signed: 02/23/2022 6:53:41 PM By: Dellie Catholic RN Entered By: Dellie Catholic on 02/23/2022 16:41:23 -------------------------------------------------------------------------------- Problem List Details Patient Name: Date of Service: Cathy Jenkins, [place] [facility] C. 02/23/2022 3:15 PM Medical Record Number: [id number] Patient Account Number: [id number] Date of Birth/Sex: Treating RN: 16-Sep-1941 (81 y.o. Cathy [name] Primary Care Provider: Donald Prose Other Clinician: Referring Provider: Treating Provider/Extender: Claudia Desanctis Weeks in Treatment: 8 Active Problems ICD-10 Encounter Code Description Active Date MDM Diagnosis I89.0 Lymphedema, not elsewhere classified 12/28/2021 No Yes W46.65 Chronic diastolic (congestive) heart failure 12/28/2021 No Yes L97.211 Non-pressure chronic ulcer of right calf limited to breakdown of skin 02/07/2022 No Yes Inactive Problems ICD-10 Code Description Active Date Inactive  Date L97.212 Non-pressure chronic ulcer of right calf with fat layer exposed 12/28/2021 12/28/2021 L97.321 Non-pressure chronic ulcer of left ankle limited to breakdown of skin 12/28/2021 12/28/2021 L97.221 Non-pressure chronic ulcer of left calf limited to breakdown of skin 01/13/2022 01/13/2022 Resolved Problems Electronic Signature(s) Signed: 02/23/2022 4:37:21 PM By:  Fredirick Maudlin MD FACS Entered By: Fredirick Maudlin on 02/23/2022 16:37:21 -------------------------------------------------------------------------------- Progress Note Details Patient Name: Date of Service: Cathy [name], [place] [facility] C. 02/23/2022 3:15 PM Medical Record Number: [id number] Patient Account Number: [id number] Date of Birth/Sex: Treating RN: 07-Jul-1941 (81 y.o. Cathy [name] Primary Care Provider: Donald Prose Other Clinician: Referring Provider: Treating Provider/Extender: Claudia Desanctis Weeks in Treatment: 8 Subjective Chief Complaint Information obtained from Patient Patient seen for complaints of Non-Healing Wounds. 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 She is accompanied by her son who helps provide some of the medical history, in addition to review of the electronic medical records. Apparently, starting around Thanksgiving, her legs became progressively more strolling and red. They eventually broke open with multiple weeping ulcers. She was treated for cellulitis with a series of courses of antibiotics including doxycycline Keflex moxifloxacin (which he did not tolerate) and ciprofloxacin. Despite this, her wounds did not improve. Apparently, they were applying hydrogen peroxide to the wounds. She was referred to the lymphedema clinic at [facility]. Due to the open wounds, no significant intervention  could be implemented. She was referred to the wound center in order to address the open wounds. She has what sounds like a very old pair of juxta lite stockings that she does not use; they are likely too old to be effective anymore at this time. She does not wear any other form of compression stocking and she does not have lymphedema pumps. The wounds are on her bilateral lower extremities. The right is nearly circumferential and is covered with heavy crusting and eschar. Some areas are extremely dry and others are moist and have significant slough. On the left, the area is much smaller but also has crusted eschar and slough. There is a fairly strong odor coming from the wounds. The drainage does not appear purulent. They are not particularly tender. 01/04/2022: T oday, her wounds are much improved. The right sided wound has partially healed, separating the previously circumferential wound into anterior and posterior sites. There is significantly less slough and the moisture balance is more appropriate. The wound on the left is nearly closed with just a punctate opening. It is clean without any significant drainage. There is no odor. 01/13/2022: The right sided wound is much smaller and more epithelialized. No slough buildup and edema control is good. The left-sided wound is closed, but she has a new open area just above where her compression wraps were. This appears to be a small blister that has opened. No odor and no significant drainage from any of the wounds. 01/18/2022: There is just a small open area on the posterior calf on the right. The area that opened just above her compression wraps on the left is smaller and nearly closed. 01/30/2022: Her wounds are healed. She did not bring the compression stockings that we ordered for her; she says they were not received. We do have a photo from the delivery and she agrees that this is her place of residence. She resides in a retirement community so perhaps  somebody else picked them up. 02/07/2022: Unfortunately she has new open wounds on her bilateral lower extremities. She says that the stockings that came with her juxta lites are too tight. The neoprene wraps themselves are fine but the underlying stocking has been causing a lot of friction and irritation  to her skin as they are donned and doffed. She has ulcers on both legs, at the left ankle and right calf. They are superficial and limited to breakdown of skin but they are weeping serous fluid. 02/16/2022: The wounds on her left leg have healed. On the right, they are very superficial with just pinpoint openings at this time. We have not yet resolved the issue related to her juxta lite stockings yet. 02/23/2022: All of her wounds are healed. We will not be able to get her a larger size of juxta lite stockings until after June 5 due to insurance coverage issues. Patient History Information obtained from Patient, Caregiver. Family History Unknown History. Social History Former smoker - quit in 1980s, Alcohol Use - Never, Drug Use - No History, Caffeine Use - Rarely. Medical History Hematologic/Lymphatic Patient has history of Lymphedema - lower legs Cardiovascular Patient has history of Congestive Heart Failure, Hypertension Musculoskeletal Patient has history of Osteoarthritis Objective Constitutional She is hypertensive, but asymptomatic.Marland Kitchen No acute distress. Vitals Time Taken: 4:00 PM, Height: 58 in, Weight: 138 lbs, BMI: 28.8, Temperature: 98.1 F, Pulse: 91 bpm, Respiratory Rate: 20 breaths/min, Blood Pressure: 176/96 mmHg. Respiratory Normal work of breathing on room air. General Notes: 02/23/2022: All of her wounds have healed. Integumentary (Hair, Skin) Wound #5 status is Healed - Epithelialized. Original cause of wound was Gradually Appeared. The date acquired was: 02/02/2022. The wound has been in treatment 2 weeks. The wound is located on the Right,Anterior Lower Leg. The wound  measures 0cm length x 0cm width x 0cm depth; 0cm^2 area and 0cm^3 volume. There is no tunneling or undermining noted. There is a none present amount of drainage noted. The wound margin is distinct with the outline attached to the wound base. There is no granulation within the wound bed. There is no necrotic tissue within the wound bed. Wound #6 status is Healed - Epithelialized. Original cause of wound was Gradually Appeared. The date acquired was: 02/02/2022. The wound has been in treatment 2 weeks. The wound is located on the Right,Posterior Lower Leg. The wound measures 0cm length x 0cm width x 0cm depth; 0cm^2 area and 0cm^3 volume. There is no tunneling or undermining noted. There is a none present amount of drainage noted. There is large (67-100%) red granulation within the wound bed. There is no necrotic tissue within the wound bed. Assessment Active Problems ICD-10 Lymphedema, not elsewhere classified Chronic diastolic (congestive) heart failure Non-pressure chronic ulcer of right calf limited to breakdown of skin Plan Follow-up Appointments: Return Appointment in 1 week. - Dr Celine Ahr Room 3 Bathing/ Shower/ Hygiene: Other Bathing/Shower/Hygiene Orders/Instructions: - Please keep both legs dry. Use cast protectors for legs if showering or taking a bath Edema Control - Lymphedema / SCD / Other: Avoid standing for long periods of time. Moisturize legs daily. Non Wound Condition: Other Non Wound Condition Orders/Instructions: - Compression wrap in 3 layer ( with UNNA at top) WOUND #6: - Lower Leg Wound Laterality: Right, Posterior Secondary Dressing: ABD Pad, 8x10 1 x Per Week/15 Days Discharge Instructions: Apply over primary dressing as directed. Secondary Dressing: Woven Gauze Sponge, Non-Sterile 4x4 in 1 x Per Week/15 Days Discharge Instructions: Apply over primary dressing as directed. Compression Stockings: Jobst Farrow Wrap 4000 Compression Amount: 30-40 mmHg  (left) Compression Amount: 30-40 mmHg (right) Discharge Instructions: Apply Francia Greaves daily as instructed. Apply first thing in the morning, remove at night before bed. 02/23/2022: Her wounds have healed. We will not be able to get her  a larger size of juxta lite stockings until next week. In the interim, she will just wear standard diabetic stockings and apply the neoprene and Velcro wraps to provide the compression she requires. We will have her follow-up as needed. Electronic Signature(s) Signed: 02/23/2022 4:41:24 PM By: Fredirick Maudlin MD FACS Entered By: Fredirick Maudlin on 02/23/2022 16:41:24 -------------------------------------------------------------------------------- HxROS Details Patient Name: Date of Service: Cathy Jenkins, [place] [facility] RET C. 02/23/2022 3:15 PM Medical Record Number: [id number] Patient Account Number: [id number] Date of Birth/Sex: Treating RN: Feb 16, 1941 (81 y.o. Cathy [name] Primary Care Provider: Donald Prose Other Clinician: Referring Provider: Treating Provider/Extender: Claudia Desanctis Weeks in Treatment: 8 Information Obtained From Patient Caregiver Hematologic/Lymphatic Medical History: Positive for: Lymphedema - lower legs Cardiovascular Medical History: Positive for: Congestive Heart Failure; Hypertension Musculoskeletal Medical History: Positive for: Osteoarthritis Immunizations Pneumococcal Vaccine: Received Pneumococcal Vaccination: Yes Received Pneumococcal Vaccination On or After 60th Birthday: No Implantable Devices None Family and Social History Unknown History: Yes; Former smoker - quit in 1980s; Alcohol Use: Never; Drug Use: No History; Caffeine Use: Rarely; Financial Concerns: No; Food, Clothing or Shelter Needs: No; Support System Lacking: No; Transportation Concerns: No Electronic Signature(s) Signed: 02/23/2022 4:49:27 PM By: Fredirick Maudlin MD FACS Signed: 02/23/2022 6:53:41 PM By: Dellie Catholic RN Entered By: Fredirick Maudlin on 02/23/2022 16:39:11 -------------------------------------------------------------------------------- SuperBill Details Patient Name: Date of Service: Cathy Jenkins, [place] [facility] RET C. 02/23/2022 Medical Record Number: [id number] Patient Account Number: [id number] Date of Birth/Sex: Treating RN: 23-Feb-1941 (81 y.o. Cathy [name] Primary Care Provider: Donald Prose Other Clinician: Referring Provider: Treating Provider/Extender: Claudia Desanctis Weeks in Treatment: 8 Diagnosis Coding ICD-10 Codes Code Description I89.0 Lymphedema, not elsewhere classified C61.96 Chronic diastolic (congestive) heart failure L97.211 Non-pressure chronic ulcer of right calf limited to breakdown of skin Facility Procedures CPT4 Code: [id number] Description: 99213 - WOUND CARE VISIT-LEV 3 EST PT Modifier: Quantity: 1 Physician Procedures : CPT4 Code Description Modifier [id number] 99213 - WC PHYS LEVEL 3 - EST PT ICD-10 Diagnosis Description I89.0 Lymphedema, not elsewhere classified O99.80 Chronic diastolic (congestive) heart failure Quantity: 1 Electronic Signature(s) Signed: 02/23/2022 6:53:41 PM By: Dellie Catholic RN Signed: 02/24/2022 7:36:54 AM By: Fredirick Maudlin MD FACS Previous Signature: 02/23/2022 4:41:36 PM Version By: Fredirick Maudlin MD FACS Entered By: Dellie Catholic on 02/23/2022 17:52:45

## 2022-02-27 ENCOUNTER — Encounter: Payer: Medicare Other | Admitting: Occupational Therapy

## 2022-03-01 ENCOUNTER — Encounter (HOSPITAL_BASED_OUTPATIENT_CLINIC_OR_DEPARTMENT_OTHER): Payer: Medicare Other | Admitting: General Surgery

## 2022-03-03 ENCOUNTER — Encounter: Payer: Medicare Other | Admitting: Occupational Therapy

## 2022-03-06 ENCOUNTER — Encounter: Payer: Medicare Other | Admitting: Occupational Therapy

## 2022-03-08 ENCOUNTER — Encounter (HOSPITAL_BASED_OUTPATIENT_CLINIC_OR_DEPARTMENT_OTHER): Payer: Medicare Other | Admitting: General Surgery

## 2022-03-08 ENCOUNTER — Encounter (HOSPITAL_BASED_OUTPATIENT_CLINIC_OR_DEPARTMENT_OTHER): Payer: Self-pay

## 2022-03-08 ENCOUNTER — Encounter: Payer: Medicare Other | Admitting: Occupational Therapy

## 2022-03-08 DIAGNOSIS — I890 Lymphedema, not elsewhere classified: Secondary | ICD-10-CM | POA: Diagnosis not present

## 2022-03-08 NOTE — Progress Notes (Signed)
Cathy [name], FERRIE ([id number]) Visit Report for 03/08/2022 Chief Complaint Document Details Patient Name: Date of Service: [place], [place] RGA RET C. 03/08/2022 2:30 PM Medical Record Number: [id number] Patient Account Number: [id number] Date of Birth/Sex: Treating RN: Jan 01, 1941 (81 y.o. F) Primary Care Provider: Donald Prose Other Clinician: Referring Provider: Treating Provider/Extender: Claudia Desanctis Weeks in Treatment: 10 Information Obtained from: Patient Chief Complaint Patient seen for complaints of Non-Healing Wounds. Electronic Signature(s) Signed: 03/08/2022 3:13:36 PM By: Fredirick Maudlin MD FACS Entered By: Fredirick Maudlin on 03/08/2022 15:13:36 -------------------------------------------------------------------------------- HPI Details Patient Name: Date of Service: Cathy Jenkins, MA RGA RET C. 03/08/2022 2:30 PM Medical Record Number: [id number] Patient Account Number: [id number] Date of Birth/Sex: Treating RN: December 26, 1940 (81 y.o. 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 She is accompanied by her son who helps provide some of the medical history, in addition to review of the electronic medical records. Apparently, starting around Thanksgiving, her legs became progressively more strolling and red. They eventually broke open with multiple weeping ulcers. She was treated for cellulitis with a series of courses of antibiotics including doxycycline Keflex moxifloxacin (which he did not tolerate) and ciprofloxacin. Despite this, her wounds did not improve. Apparently, they were applying hydrogen peroxide to the wounds. She was referred to  the lymphedema clinic at [facility]. Due to the open wounds, no significant intervention could be implemented. She was referred to the wound center in order to address the open wounds. She has what sounds like a very old pair of juxta lite stockings that she does not use; they are likely too old to be effective anymore at this time. She does not wear any other form of compression stocking and she does not have lymphedema pumps. The wounds are on her bilateral lower extremities. The right is nearly circumferential and is covered with heavy crusting and eschar. Some areas are extremely dry and others are moist and have significant slough. On the left, the area is much smaller but also has crusted eschar and slough. There is a fairly strong odor coming from the wounds. The drainage does not appear purulent. They are not particularly tender. 01/04/2022: T oday, her wounds are much improved. The right sided wound has partially healed, separating the previously circumferential wound into anterior and posterior sites. There is significantly less slough and the moisture balance is more appropriate. The wound on the left is nearly closed with just a punctate opening. It is clean without any significant drainage. There is no odor. 01/13/2022: The right sided wound is much smaller and more epithelialized. No slough buildup and edema control is good. The left-sided wound is closed, but she has a new open area just above where her compression wraps were. This appears to be a small blister that has opened. No odor and no significant drainage from any of the wounds. 01/18/2022: There is just a small open area on the posterior calf on the right. The area that opened just above her compression wraps on the left is smaller and nearly closed. 01/30/2022: Her wounds are healed. She did not bring the compression stockings that we ordered for her; she says they were not received. We do have a photo from  the delivery and she  agrees that this is her place of residence. She resides in a retirement community so perhaps somebody else picked them up. 02/07/2022: Unfortunately she has new open wounds on her bilateral lower extremities. She says that the stockings that came with her juxta lites are too tight. The neoprene wraps themselves are fine but the underlying stocking has been causing a lot of friction and irritation to her skin as they are donned and doffed. She has ulcers on both legs, at the left ankle and right calf. They are superficial and limited to breakdown of skin but they are weeping serous fluid. 02/16/2022: The wounds on her left leg have healed. On the right, they are very superficial with just pinpoint openings at this time. We have not yet resolved the issue related to her juxta lite stockings yet. 02/23/2022: All of her wounds are healed. We will not be able to get her a larger size of juxta lite stockings until after June 5 due to insurance coverage issues. 03/08/2022: For unclear reasons, her son made an appointment for her today. Her wounds remain closed. Electronic Signature(s) Signed: 03/08/2022 3:14:49 PM By: Fredirick Maudlin MD FACS Entered By: Fredirick Maudlin on 03/08/2022 15:14:49 -------------------------------------------------------------------------------- Physical Exam Details Patient Name: Date of Service: Cathy Jenkins, MA RGA RET C. 03/08/2022 2:30 PM Medical Record Number: [id number] Patient Account Number: [id number] Date of Birth/Sex: Treating RN: 06-Apr-1941 (81 y.o. F) Primary Care Provider: Donald Prose Other Clinician: Referring Provider: Treating Provider/Extender: Claudia Desanctis Weeks in Treatment: 10 Constitutional Hypertensive, asymptomatic. Slightly tachycardic, asymptomatic. . . No acute distress.Marland Kitchen Respiratory Normal work of breathing on room air.. Notes 03/08/2022: Her wounds remain healed. Electronic Signature(s) Signed: 03/08/2022 3:15:30 PM  By: Fredirick Maudlin MD FACS Entered By: Fredirick Maudlin on 03/08/2022 15:15:30 -------------------------------------------------------------------------------- Physician Orders Details Patient Name: Date of Service: Cathy Jenkins, MA RGA RET C. 03/08/2022 2:30 PM Medical Record Number: [id number] Patient Account Number: [id number] Date of Birth/Sex: Treating RN: [birth year] (81 y.o. Elam Dutch Primary Care Provider: Donald Prose Other Clinician: Referring Provider: Treating Provider/Extender: Claudia Desanctis Weeks in Treatment: 10 Verbal / Phone Orders: No Diagnosis Coding ICD-10 Coding Code Description I89.0 Lymphedema, not elsewhere classified F75.88 Chronic diastolic (congestive) heart failure L97.211 Non-pressure chronic ulcer of right calf limited to breakdown of skin Discharge From [facility] Discharge from [place] - follow up with lymphedema clinic Edema Control - Lymphedema / SCD / Other Elevate legs to the level of the heart or above for 30 minutes daily and/or when sitting, a frequency of: Avoid standing for long periods of time. Patient to wear own compression stockings every day. Exercise regularly Moisturize legs daily. Electronic Signature(s) Signed: 03/08/2022 3:15:39 PM By: Fredirick Maudlin MD FACS Entered By: Fredirick Maudlin on 03/08/2022 15:15:39 -------------------------------------------------------------------------------- Problem List Details Patient Name: Date of Service: Cathy Jenkins, MA RGA RET C. 03/08/2022 2:30 PM Medical Record Number: [id number] Patient Account Number: [id number] Date of Birth/Sex: Treating RN: 05-21-1941 (81 y.o. F) Primary Care Provider: Donald Prose Other Clinician: Referring Provider: Treating Provider/Extender: Claudia Desanctis Weeks in Treatment: 10 Active Problems ICD-10 Encounter Code Description Active Date MDM Diagnosis I89.0 Lymphedema, not elsewhere classified 12/28/2021 No  Yes B58.30 Chronic diastolic (congestive) heart failure 12/28/2021 No Yes L97.211 Non-pressure chronic ulcer of right calf limited to breakdown of skin 02/07/2022 No Yes Inactive Problems ICD-10 Code Description Active Date Inactive Date L97.212 Non-pressure chronic ulcer of right calf with fat layer exposed 12/28/2021 12/28/2021 L97.321 Non-pressure chronic ulcer of left ankle  limited to breakdown of skin 12/28/2021 12/28/2021 L97.221 Non-pressure chronic ulcer of left calf limited to breakdown of skin 01/13/2022 01/13/2022 Resolved Problems Electronic Signature(s) Signed: 03/08/2022 3:13:27 PM By: Fredirick Maudlin MD FACS Entered By: Fredirick Maudlin on 03/08/2022 15:13:27 -------------------------------------------------------------------------------- Progress Note Details Patient Name: Date of Service: Cathy Jenkins, MA RGA RET C. 03/08/2022 2:30 PM Medical Record Number: [id number] Patient Account Number: [id number] Date of Birth/Sex: Treating RN: Mar 03, 1941 (81 y.o. F) Primary Care Provider: Donald Prose Other Clinician: Referring Provider: Treating Provider/Extender: Claudia Desanctis Weeks in Treatment: 10 Subjective Chief Complaint Information obtained from Patient Patient seen for complaints of Non-Healing Wounds. 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 She is accompanied by her son who helps provide some of the medical history, in addition to review of the electronic medical records. Apparently, starting around Thanksgiving, her legs became progressively more strolling and red. They eventually broke open with multiple weeping ulcers. She was treated for cellulitis with a series of courses of antibiotics including doxycycline Keflex moxifloxacin (which he did not tolerate) and ciprofloxacin. Despite this, her  wounds did not improve. Apparently, they were applying hydrogen peroxide to the wounds. She was referred to the lymphedema clinic at [facility]. Due to the open wounds, no significant intervention could be implemented. She was referred to the wound center in order to address the open wounds. She has what sounds like a very old pair of juxta lite stockings that she does not use; they are likely too old to be effective anymore at this time. She does not wear any other form of compression stocking and she does not have lymphedema pumps. The wounds are on her bilateral lower extremities. The right is nearly circumferential and is covered with heavy crusting and eschar. Some areas are extremely dry and others are moist and have significant slough. On the left, the area is much smaller but also has crusted eschar and slough. There is a fairly strong odor coming from the wounds. The drainage does not appear purulent. They are not particularly tender. 01/04/2022: T oday, her wounds are much improved. The right sided wound has partially healed, separating the previously circumferential wound into anterior and posterior sites. There is significantly less slough and the moisture balance is more appropriate. The wound on the left is nearly closed with just a punctate opening. It is clean without any significant drainage. There is no odor. 01/13/2022: The right sided wound is much smaller and more epithelialized. No slough buildup and edema control is good. The left-sided wound is closed, but she has a new open area just above where her compression wraps were. This appears to be a small blister that has opened. No odor and no significant drainage from any of the wounds. 01/18/2022: There is just a small open area on the posterior calf on the right. The area that opened just above her compression wraps on the left is smaller and nearly closed. 01/30/2022: Her wounds are healed. She did not bring  the compression stockings that we ordered for her; she says they were not received. We do have a photo from the delivery and she agrees that this is her place of residence. She resides in a retirement community so perhaps somebody else picked them up. 02/07/2022: Unfortunately she has new open wounds on her bilateral lower extremities. She says that the stockings  that came with her juxta lites are too tight. The neoprene wraps themselves are fine but the underlying stocking has been causing a lot of friction and irritation to her skin as they are donned and doffed. She has ulcers on both legs, at the left ankle and right calf. They are superficial and limited to breakdown of skin but they are weeping serous fluid. 02/16/2022: The wounds on her left leg have healed. On the right, they are very superficial with just pinpoint openings at this time. We have not yet resolved the issue related to her juxta lite stockings yet. 02/23/2022: All of her wounds are healed. We will not be able to get her a larger size of juxta lite stockings until after June 5 due to insurance coverage issues. 03/08/2022: For unclear reasons, her son made an appointment for her today. Her wounds remain closed. Patient History Information obtained from Patient, Caregiver. Family History Unknown History. Social History Former smoker - quit in 1980s, Alcohol Use - Never, Drug Use - No History, Caffeine Use - Rarely. Medical History Hematologic/Lymphatic Patient has history of Lymphedema - lower legs Cardiovascular Patient has history of Congestive Heart Failure, Hypertension Musculoskeletal Patient has history of Osteoarthritis Objective Constitutional Hypertensive, asymptomatic. Slightly tachycardic, asymptomatic. No acute distress.. Vitals Time Taken: 2:59 PM, Height: 58 in, Weight: 138 lbs, BMI: 28.8, Temperature: 98.2 F, Pulse: 111 bpm, Respiratory Rate: 22 breaths/min, Blood Pressure: 150/80 mmHg. Respiratory Normal  work of breathing on room air.. General Notes: 03/08/2022: Her wounds remain healed. Assessment Active Problems ICD-10 Lymphedema, not elsewhere classified Chronic diastolic (congestive) heart failure Non-pressure chronic ulcer of right calf limited to breakdown of skin Plan Discharge From [facility] Services: Discharge from [place] - follow up with lymphedema clinic Edema Control - Lymphedema / SCD / Other: Elevate legs to the level of the heart or above for 30 minutes daily and/or when sitting, a frequency of: Avoid standing for long periods of time. Patient to wear own compression stockings every day. Exercise regularly Moisturize legs daily. 03/08/2022: Her wounds remain healed. There is no need for her to return to the wound care center unless new wounds occur. Electronic Signature(s) Signed: 03/08/2022 3:15:59 PM By: Fredirick Maudlin MD FACS Entered By: Fredirick Maudlin on 03/08/2022 15:15:58 -------------------------------------------------------------------------------- HxROS Details Patient Name: Date of Service: Cathy Jenkins, MA RGA RET C. 03/08/2022 2:30 PM Medical Record Number: [id number] Patient Account Number: [id number] Date of Birth/Sex: Treating RN: 08-05-1941 (81 y.o. F) Primary Care Provider: Donald Prose Other Clinician: Referring Provider: Treating Provider/Extender: Claudia Desanctis Weeks in Treatment: 10 Information Obtained From Patient Caregiver Hematologic/Lymphatic Medical History: Positive for: Lymphedema - lower legs Cardiovascular Medical History: Positive for: Congestive Heart Failure; Hypertension Musculoskeletal Medical History: Positive for: Osteoarthritis Immunizations Pneumococcal Vaccine: Received Pneumococcal Vaccination: Yes Received Pneumococcal Vaccination On or After 60th Birthday: No Implantable Devices None Family and Social History Unknown History: Yes; Former smoker - quit in 1980s; Alcohol Use: Never; Drug Use:  No History; Caffeine Use: Rarely; Financial Concerns: No; Food, Clothing or Shelter Needs: No; Support System Lacking: No; Transportation Concerns: No Electronic Signature(s) Signed: 03/08/2022 3:56:32 PM By: Fredirick Maudlin MD FACS Entered By: Fredirick Maudlin on 03/08/2022 15:14:53 -------------------------------------------------------------------------------- SuperBill Details Patient Name: Date of Service: Cathy Jenkins, [place] RGA RET C. 03/08/2022 Medical Record Number: [id number] Patient Account Number: [id number] Date of Birth/Sex: Treating RN: [birth year] (81 y.o. Elam Dutch Primary Care Provider: Donald Prose Other Clinician: Referring Provider: Treating Provider/Extender: Claudia Desanctis Weeks in Treatment: 10  Diagnosis Coding ICD-10 Codes Code Description I89.0 Lymphedema, not elsewhere classified W29.56 Chronic diastolic (congestive) heart failure L97.211 Non-pressure chronic ulcer of right calf limited to breakdown of skin Facility Procedures CPT4 Code: [id number] Description: 99213 - WOUND CARE VISIT-LEV 3 EST PT Modifier: Quantity: 1 Physician Procedures : CPT4 Code Description Modifier [id number] - WC PHYS LEVEL 2 - EST PT ICD-10 Diagnosis Description I89.0 Lymphedema, not elsewhere classified L24.40 Chronic diastolic (congestive) heart failure Quantity: 1 Electronic Signature(s) Signed: 03/08/2022 3:16:09 PM By: Fredirick Maudlin MD FACS Entered By: Fredirick Maudlin on 03/08/2022 15:16:09

## 2022-03-08 NOTE — Progress Notes (Signed)
Cathy [name], FOLKES ([id number]) Visit Report for 03/08/2022 Arrival Information Details Patient Name: Date of Service: [place], [place] RGA RET C. 03/08/2022 2:30 PM Medical Record Number: [id number] Patient Account Number: [id number] Date of Birth/Sex: Treating RN: [birth year] (81 y.o. Martyn [name], Cathy Primary Care [name]: Donald Prose Other Clinician: Referring Luisa Louk: Treating Adore Kithcart/Extender: Claudia Desanctis Weeks in Treatment: 10 Visit Information History Since Last Visit Added or deleted any medications: No Patient Arrived: [name] Any new allergies or adverse reactions: No Arrival Time: 14:55 Had a fall or experienced change in No Accompanied By: son activities of daily living that may affect Transfer Assistance: Manual risk of falls: Patient Identification Verified: Yes Signs or symptoms of abuse/neglect since last visito No Secondary Verification Process Completed: Yes Hospitalized since last visit: No Patient Requires Transmission-Based Precautions: No Implantable device outside of the clinic excluding No Patient Has Alerts: No cellular tissue based products placed in the center since last visit: Has Dressing in Place as Prescribed: Yes Has Compression in Place as Prescribed: Yes Pain Present Now: No Electronic Signature(s) Signed: 03/08/2022 5:43:09 PM By: Baruch Gouty RN, BSN Entered By: Baruch Gouty on 03/08/2022 14:59:51 -------------------------------------------------------------------------------- Clinic Level of Care Assessment Details Patient Name: Date of Service: [place], [place] RGA RET C. 03/08/2022 2:30 PM Medical Record Number: [id number] Patient Account Number: [id number] Date of Birth/Sex: Treating RN: 1941-08-18 (81 y.o. Cathy [name] Primary Care Taiesha Bovard: Donald Prose Other Clinician: Referring Ezzard Ditmer: Treating Zerek Litsey/Extender: Claudia Desanctis Weeks in Treatment: 10 Clinic Level of Care Assessment Items TOOL 4  Quantity Score _0  - 0 Use when only an EandM is performed on FOLLOW-UP visit ASSESSMENTS - Nursing Assessment / Reassessment X- 1 10 Reassessment of Co-morbidities (includes updates in patient status) X- 1 5 Reassessment of Adherence to Treatment Plan ASSESSMENTS - Wound and Skin A ssessment / Reassessment _1  - 0 Simple Wound Assessment / Reassessment - one wound _2  - 0 Complex Wound Assessment / Reassessment - multiple wounds X- 1 10 Dermatologic / Skin Assessment (not related to wound area) ASSESSMENTS - Focused Assessment X- 2 5 Circumferential Edema Measurements - multi extremities _3  - 0 Nutritional Assessment / Counseling / Intervention X- 1 5 Lower Extremity Assessment (monofilament, tuning fork, pulses) _4  - 0 Peripheral Arterial Disease Assessment (using hand held doppler) ASSESSMENTS - Ostomy and/or Continence Assessment and Care _5  - 0 Incontinence Assessment and Management _6  - 0 Ostomy Care Assessment and Management (repouching, etc.) PROCESS - Coordination of Care X - Simple Patient / Family Education for ongoing care 1 15 _7  - 0 Complex (extensive) Patient / Family Education for ongoing care X- 1 10 Staff obtains Programmer, systems, Records, T Results / Process Orders est _8  - 0 Staff telephones HHA, Nursing Homes / Clarify orders / etc _9  - 0 Routine Transfer to another Facility (non-emergent condition) _10  - 0 Routine Hospital Admission (non-emergent condition) _11  - 0 New Admissions / Biomedical engineer / Ordering NPWT Apligraf, etc. , _12  - 0 Emergency Hospital Admission (emergent condition) X- 1 10 Simple Discharge Coordination _13  - 0 Complex (extensive) Discharge Coordination PROCESS - Special Needs _14  - 0 Pediatric / Minor Patient Management _15  - 0 Isolation Patient Management _16  - 0 Hearing / Language / Visual special needs _17  - 0 Assessment of Community assistance (transportation, D/C planning, etc.) _18  - 0 Additional assistance / Altered  mentation _19  - 0 Support Surface(s) Assessment (bed, cushion, seat, etc.) INTERVENTIONS - Wound Cleansing / Measurement _20  - 0 Simple Wound Cleansing - one wound _21  -  0 Complex Wound Cleansing - multiple wounds _0  - 0 Wound Imaging (photographs - any number of wounds) _1  - 0 Wound Tracing (instead of photographs) _2  - 0 Simple Wound Measurement - one wound _3  - 0 Complex Wound Measurement - multiple wounds INTERVENTIONS - Wound Dressings _4  - 0 Small Wound Dressing one or multiple wounds _5  - 0 Medium Wound Dressing one or multiple wounds _6  - 0 Large Wound Dressing one or multiple wounds <YSAYTKZSWFUXNATF>_5<\/DDUKGURKYHCWCBJS>_2  - 0 Application of Medications - topical <GBTDVVOHYWVPXTGG>_2<\/IRSWNIOEVOJJKKXF>_8  - 0 Application of Medications - injection INTERVENTIONS - Miscellaneous _9  - 0 External ear exam _10  - 0 Specimen Collection (cultures, biopsies, blood, body fluids, etc.) _11  - 0 Specimen(s) / Culture(s) sent or taken to Lab for analysis _12  - 0 Patient Transfer (multiple staff / Civil Service fast streamer / Similar devices) _13  - 0 Simple Staple / Suture removal (25 or less) _14  - 0 Complex Staple / Suture removal (26 or more) _15  - 0 Hypo / Hyperglycemic Management (close monitor of Blood Glucose) _16  - 0 Ankle / Brachial Index (ABI) - do not check if billed separately X- 1 5 Vital Signs Has the patient been seen at the hospital within the last three years: Yes Total Score: 80 Level Of Care: New/Established - Level 3 Electronic Signature(s) Signed: 03/08/2022 5:43:09 PM By: Baruch Gouty RN, BSN Entered By: Baruch Gouty on 03/08/2022 15:05:37 -------------------------------------------------------------------------------- Encounter Discharge Information Details Patient Name: Date of Service: Cathy Jenkins, [place] RGA RET C. 03/08/2022 2:30 PM Medical Record Number: [id number] Patient Account Number: [id number] Date of Birth/Sex: Treating RN: [birth year] (81 y.o. Cathy [name] Primary Care Kandis Henry: Donald Prose Other Clinician: Referring  Mayan Kloepfer: Treating Kuzey Ogata/Extender: Claudia Desanctis Weeks in Treatment: 10 Encounter Discharge Information Items Discharge Condition: Stable Ambulatory Status: Walker Discharge Destination: Home Transportation: Private Auto Accompanied By: son Schedule Follow-up Appointment: Yes Clinical Summary of Care: Patient Declined Electronic Signature(s) Signed: 03/08/2022 5:43:09 PM By: Baruch Gouty RN, BSN Entered By: Baruch Gouty on 03/08/2022 15:11:02 -------------------------------------------------------------------------------- Lower Extremity Assessment Details Patient Name: Date of Service: [facility], [place] RGA RET C. 03/08/2022 2:30 PM Medical Record Number: [id number] Patient Account Number: [id number] Date of Birth/Sex: Treating RN: [birth year] (81 y.o. Cathy [name] Primary Care Rosalena Mccorry: Donald Prose Other Clinician: Referring Warner Laduca: Treating Doralyn Kirkes/Extender: Claudia Desanctis Weeks in Treatment: 10 Edema Assessment Assessed: [Left: No] [Right: No] Edema: [Left: Yes] [Right: Yes] Calf Left: Right: Point of Measurement: 23 cm From Medial Instep 43 cm 37 cm Ankle Left: Right: Point of Measurement: 10 cm From Medial Instep 27 cm 25.5 cm Vascular Assessment Pulses: Dorsalis Pedis Palpable: [Left:Yes] [Right:Yes] Electronic Signature(s) Signed: 03/08/2022 5:43:09 PM By: Baruch Gouty RN, BSN Entered By: Baruch Gouty on 03/08/2022 15:01:40 -------------------------------------------------------------------------------- Multi Wound Chart Details Patient Name: Date of Service: Cathy Jenkins, [place] RGA RET C. 03/08/2022 2:30 PM Medical Record Number: [id number] Patient Account Number: [id number] Date of Birth/Sex: Treating RN: 01-Apr-1941 (81 y.o. F) Primary Care Chiante Peden: Donald Prose Other Clinician: Referring Dawaun Brancato: Treating Delberta Folts/Extender: Claudia Desanctis Weeks in Treatment: 10 Vital Signs Height(in): 58 Pulse(bpm):  111 Weight(lbs): 138 Blood Pressure(mmHg): 150/80 Body Mass Index(BMI): 28.8 Temperature(F): 98.2 Respiratory Rate(breaths/min): 22 Wound Assessments Treatment Notes Electronic Signature(s) Signed: 03/08/2022 3:13:31 PM By: Fredirick Maudlin MD FACS Entered By: Fredirick Maudlin on 03/08/2022 15:13:31 -------------------------------------------------------------------------------- Pain Assessment Details Patient Name: Date of Service: Cathy Jenkins, [place] RGA RET C. 03/08/2022 2:30 PM Medical Record Number: [id number] Patient Account Number: [id number] Date of Birth/Sex: Treating RN: 1941-08-09 (81 y.o. F) [place],  [facility] Primary Care Seraphim Affinito: Donald Prose Other Clinician: Referring Zaviyar Rahal: Treating Arrabella Westerman/Extender: Claudia Desanctis Weeks in Treatment: 10 Active Problems Location of Pain Severity and Description of Pain Patient Has Paino No Site Locations Rate the pain. Rate the pain. Current Pain Level: 0 Pain Management and Medication Current Pain Management: Electronic Signature(s) Signed: 03/08/2022 5:43:09 PM By: Baruch Gouty RN, BSN Entered By: Baruch Gouty on 03/08/2022 15:00:22 -------------------------------------------------------------------------------- Patient/Caregiver Education Details Patient Name: Date of Service: Cathy Jenkins, [place] RGA RET C. 6/14/2023andnbsp2:30 PM Medical Record Number: [id number] Patient Account Number: [id number] Date of Birth/Gender: Treating RN: Feb 03, 1941 (81 y.o. Cathy [name] Primary Care Physician: Donald Prose Other Clinician: Referring Physician: Treating Physician/Extender: Claudia Desanctis Weeks in Treatment: 10 Education Assessment Education Provided To: Patient Education Topics Provided Venous: Methods: Explain/Verbal Responses: Reinforcements needed, State content correctly Electronic Signature(s) Signed: 03/08/2022 5:43:09 PM By: Baruch Gouty RN, BSN Entered By: Baruch Gouty on  03/08/2022 15:04:31 -------------------------------------------------------------------------------- [place] Details Patient Name: Date of Service: Cathy Jenkins, [place] RGA RET C. 03/08/2022 2:30 PM Medical Record Number: [id number] Patient Account Number: [id number] Date of Birth/Sex: Treating RN: [birth year] (81 y.o. Cathy [name] Primary Care Hutch Rhett: Donald Prose Other Clinician: Referring Wilmer Berryhill: Treating Caedyn Raygoza/Extender: Claudia Desanctis Weeks in Treatment: 10 Vital Signs Time Taken: 14:59 Temperature (F): 98.2 Height (in): 58 Pulse (bpm): 111 Weight (lbs): 138 Respiratory Rate (breaths/min): 22 Body Mass Index (BMI): 28.8 Blood Pressure (mmHg): 150/80 Reference Range: 80 - 120 mg / dl Electronic Signature(s) Signed: 03/08/2022 5:43:09 PM By: Baruch Gouty RN, BSN Entered By: Baruch Gouty on 03/08/2022 15:00:14

## 2022-03-13 ENCOUNTER — Encounter: Payer: Medicare Other | Admitting: Occupational Therapy

## 2022-03-15 ENCOUNTER — Encounter: Payer: Medicare Other | Admitting: Occupational Therapy

## 2022-03-20 ENCOUNTER — Encounter: Payer: Medicare Other | Admitting: Occupational Therapy

## 2022-03-22 ENCOUNTER — Encounter: Payer: Medicare Other | Admitting: Occupational Therapy

## 2022-05-14 IMAGING — DX DG PORTABLE PELVIS
1 series · 1 of 1 positions shown · non-contrast
Comparison: None.

CLINICAL DATA: Fall 2 weeks ago

EXAM:
PORTABLE PELVIS 1-2 VIEWS

[pelvis]
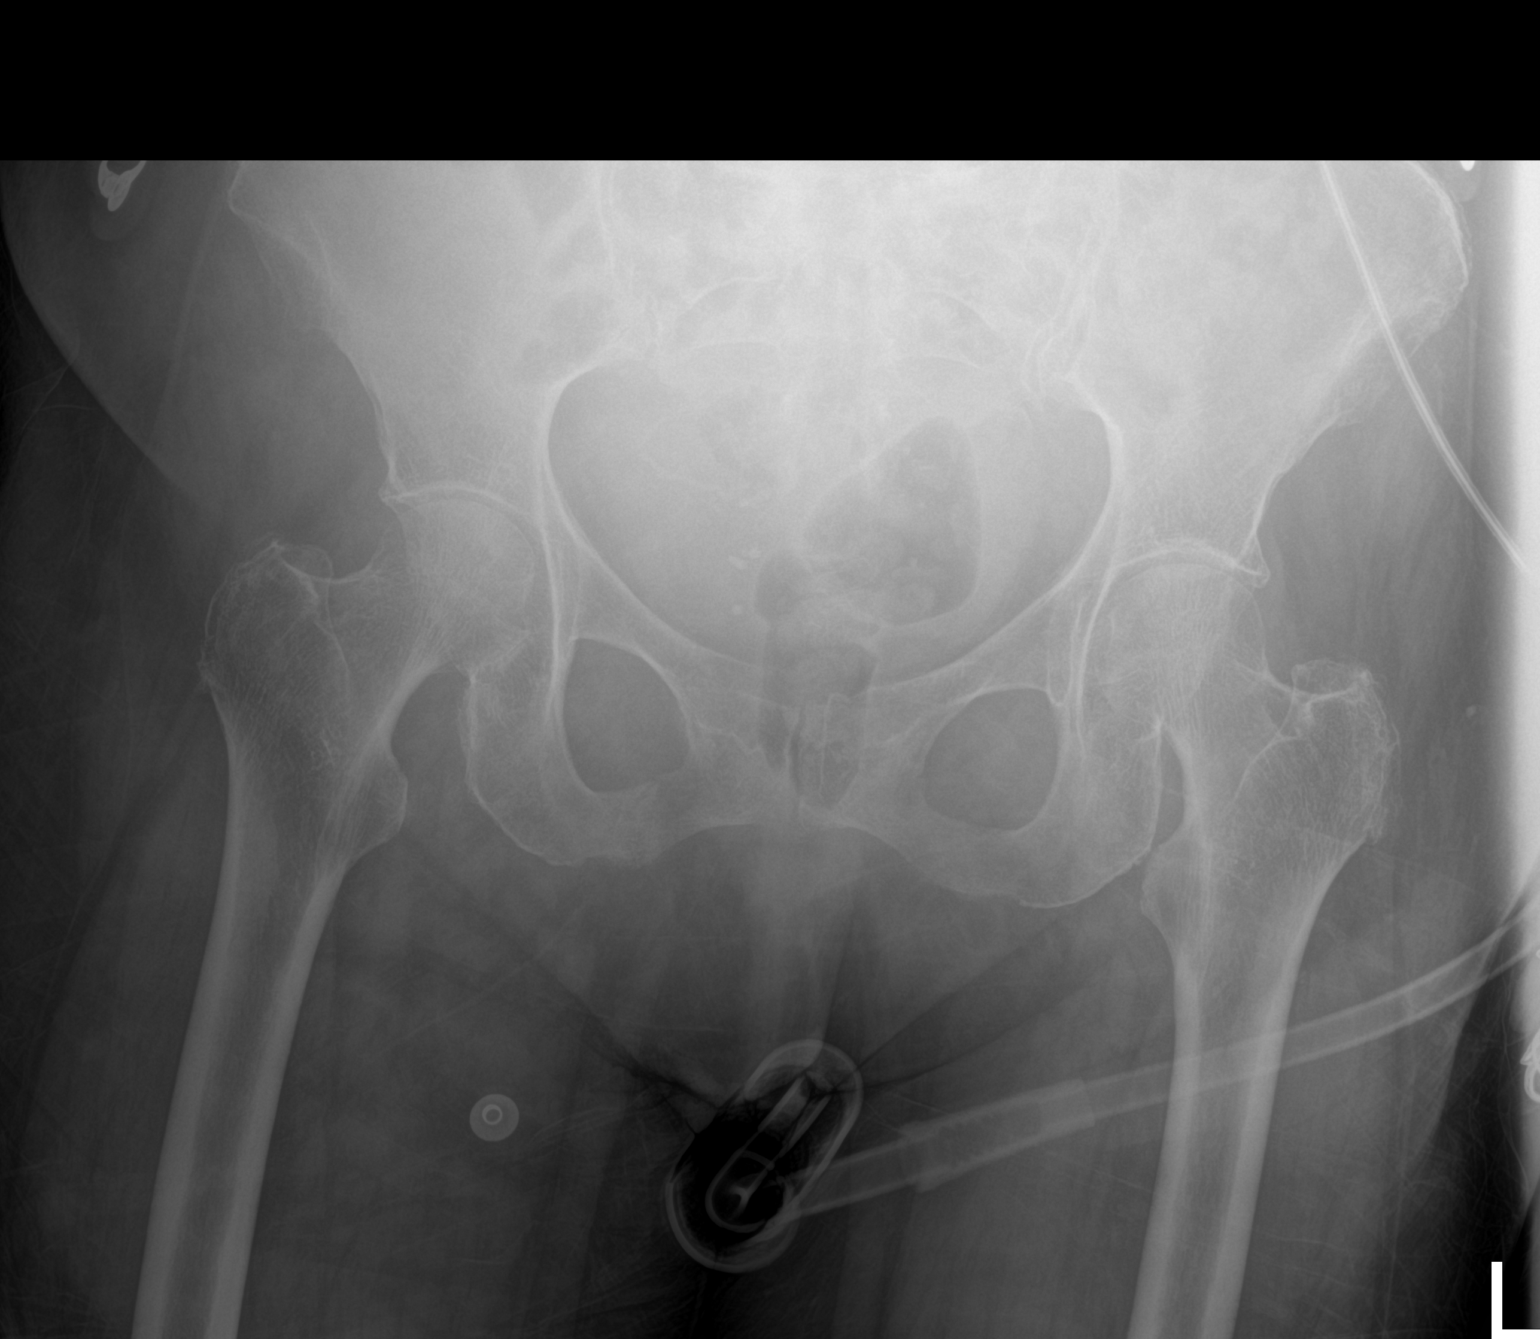

[1 of 1 positions shown; findings below may reference images not displayed]

FINDINGS: Moderately displaced fracture of the right superior pubic ramus and
parasymphyseal aspect of the right pubic bone. Nondisplaced fracture
of the right inferior pubic ramus. Possible nondisplaced fracture
involving the parasymphyseal aspect of the left pubic bone. Pubic
symphysis intact without diastasis. SI joints appear intact without
diastasis. No hip fracture or dislocation.
IMPRESSION: 1. Moderately displaced fracture of the right superior pubic ramus
and parasymphyseal aspect of the right pubic bone.
2. Nondisplaced fracture of the right inferior pubic ramus.
3. Possible nondisplaced fracture involving the parasymphyseal
aspect of the left pubic bone.

## 2022-05-14 IMAGING — CT CT CHEST-ABD-PELV W/O CM
2 of 4 series · 14 of 36 positions shown, 16 images · non-contrast
Comparison: Radiograph of same day.

CLINICAL DATA: Fall.  Dizziness.

EXAM:
CT CHEST, ABDOMEN AND PELVIS WITHOUT CONTRAST
TECHNIQUE: Multidetector CT imaging of the chest, abdomen and pelvis was
performed following the standard protocol without IV contrast.

[Series 3: cap wo 5.0 i31f 2 · axial · 0.80mm/px · z∈[+1132,+1616]mm · 11 of 117 slices shown, 13 images]
[im 10/117  mediastinal]
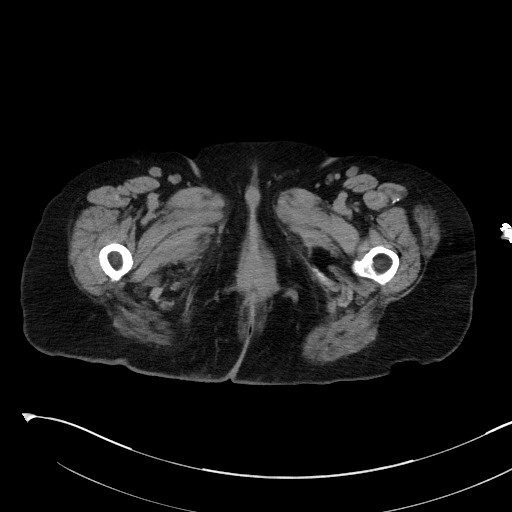
[im 10/117  bone]
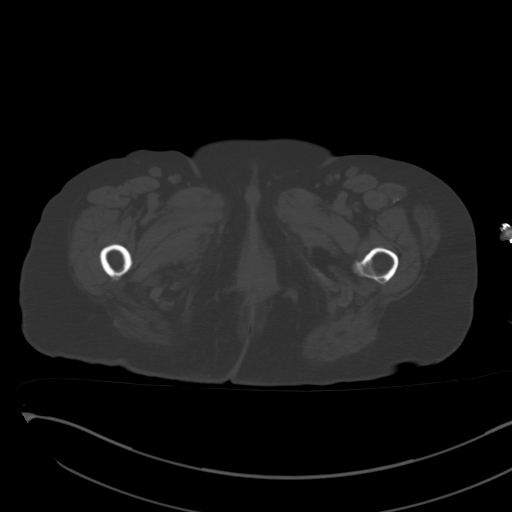
[im 20/117  mediastinal]
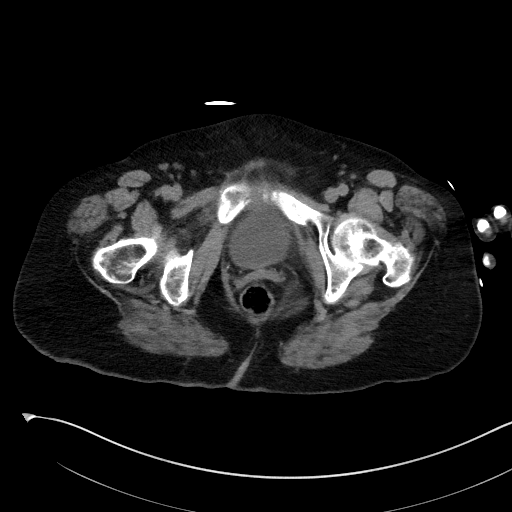
[im 30/117  mediastinal]
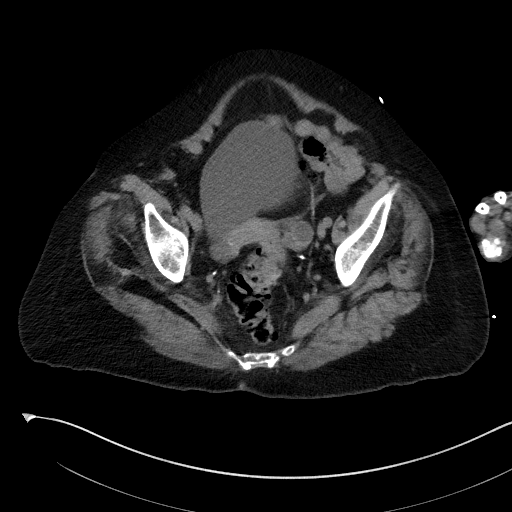
[im 39/117  mediastinal]
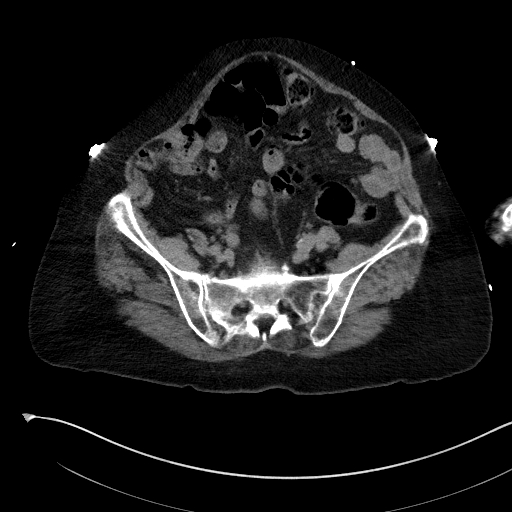
[im 49/117  mediastinal]
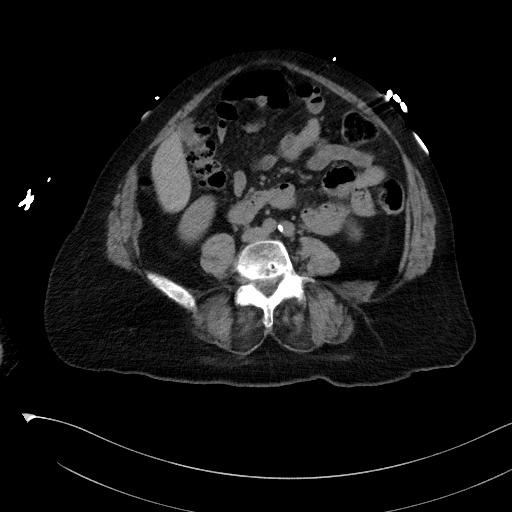
[im 59/117  mediastinal]
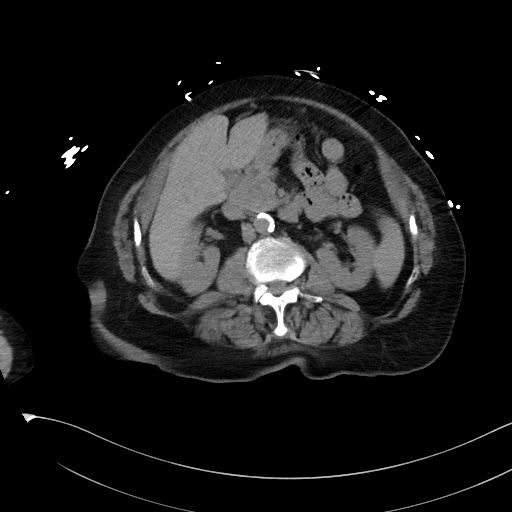
[im 68/117  mediastinal]
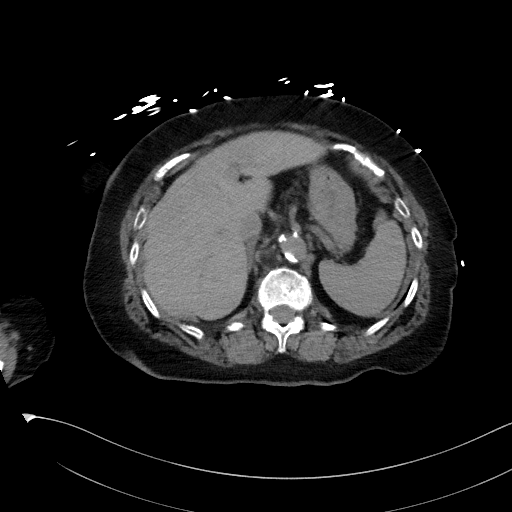
[im 78/117  mediastinal]
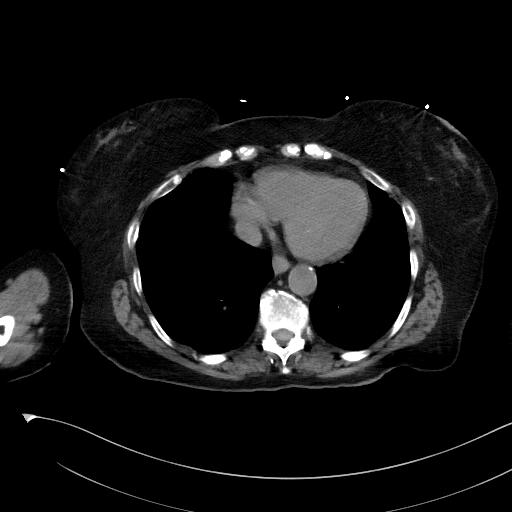
[im 88/117  mediastinal]
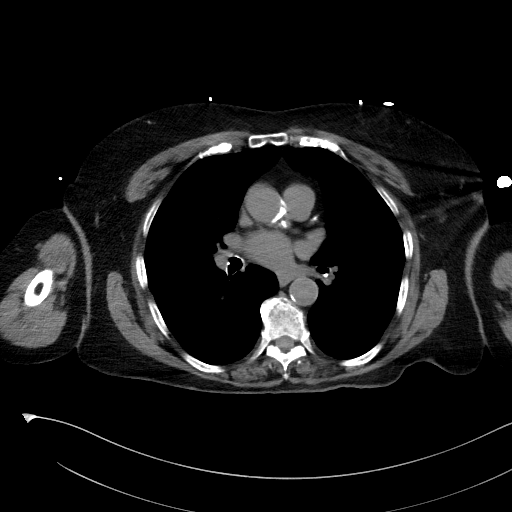
[im 88/117  bone]
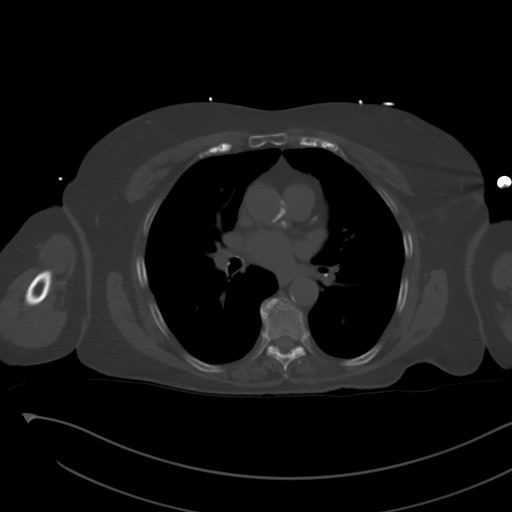
[im 97/117  mediastinal]
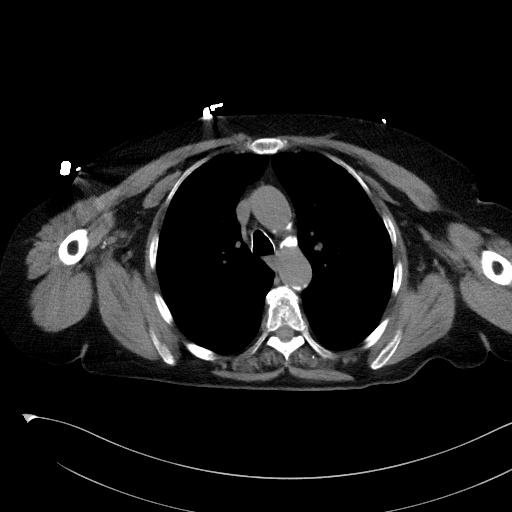
[im 107/117  mediastinal]
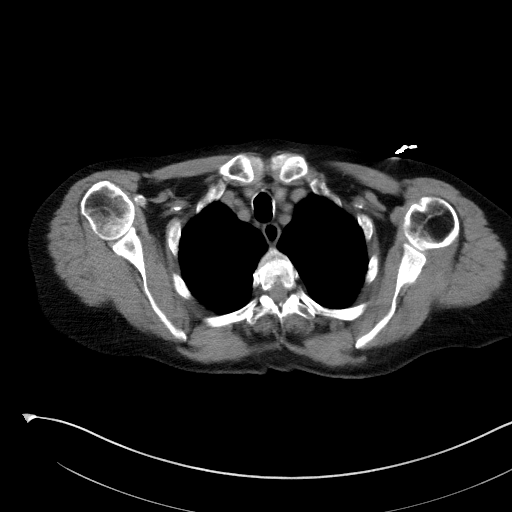

[Series 6: coronal · coronal · 0.71mm/px · 3 of 142 slices shown]
[im 29/142  mediastinal]
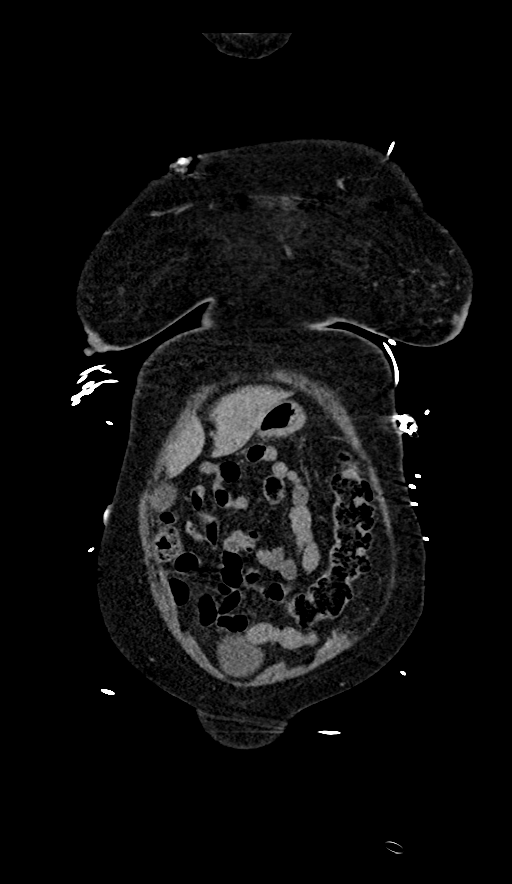
[im 57/142  mediastinal]
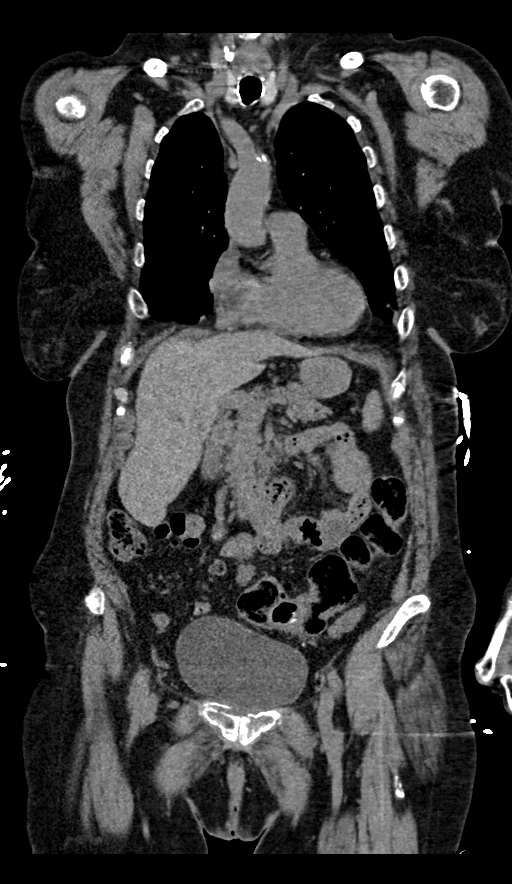
[im 85/142  mediastinal]
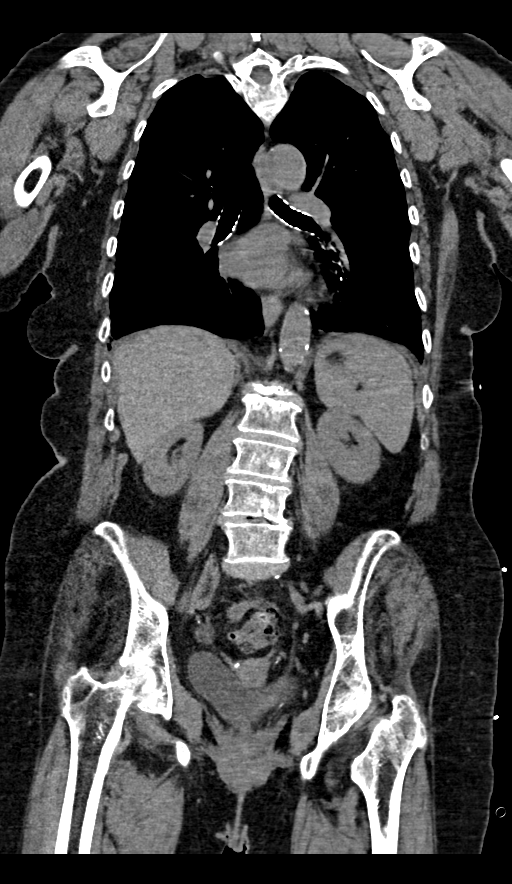

[14 of 36 positions shown; findings below may reference images not displayed]

FINDINGS: CT CHEST FINDINGS

Cardiovascular: Atherosclerosis of thoracic aorta is noted without
aneurysm formation. Normal cardiac size. No pericardial effusion.
Mild coronary artery calcifications are noted.

Mediastinum/Nodes: Small sliding-type hiatal hernia is noted. No
adenopathy is noted. Thyroid gland is unremarkable.

Lungs/Pleura: Lungs are clear. No pleural effusion or pneumothorax.

Musculoskeletal: Minimally displaced fractures are seen involving
the lateral portion of the left seventh and eighth ribs.

CT ABDOMEN PELVIS FINDINGS

Hepatobiliary: No focal liver abnormality is seen. No gallstones,
gallbladder wall thickening, or biliary dilatation.

Pancreas: Unremarkable. No pancreatic ductal dilatation or
surrounding inflammatory changes.

Spleen: Normal in size without focal abnormality.

Adrenals/Urinary Tract: Adrenal glands are unremarkable. Kidneys are
normal, without renal calculi, focal lesion, or hydronephrosis.
Bladder is unremarkable.

Stomach/Bowel: Stomach is within normal limits. Appendix appears
normal. No evidence of bowel wall thickening, distention, or
inflammatory changes. Sigmoid diverticulosis is noted without
inflammation.

Vascular/Lymphatic: Aortic atherosclerosis. No enlarged abdominal or
pelvic lymph nodes.

Reproductive: Uterus and bilateral adnexa are unremarkable.

Other: No abdominal wall hernia or abnormality. No abdominopelvic
ascites.

Musculoskeletal: Mildly displaced fractures are seen involving the
right superior and inferior pubic rami. Nondisplaced left
parasymphyseal pubic fracture is noted.
IMPRESSION: Minimally displaced fractures are seen involving the left seventh
and eighth ribs.

Mildly displaced fractures are seen involving the right superior and
inferior pubic rami.

Nondisplaced left parasymphyseal pubic fracture is noted.

Small sliding-type hiatal hernia.

Mild coronary artery calcifications are noted.

Sigmoid diverticulosis is noted without inflammation.

Aortic Atherosclerosis ([id number].F).

## 2022-05-15 IMAGING — CR DG CHEST 2V
2 series · 2 of 2 positions shown · non-contrast
Comparison: 02/18/2021

CLINICAL DATA: Altered mental status

EXAM:
CHEST - 2 VIEW

[chest lat]
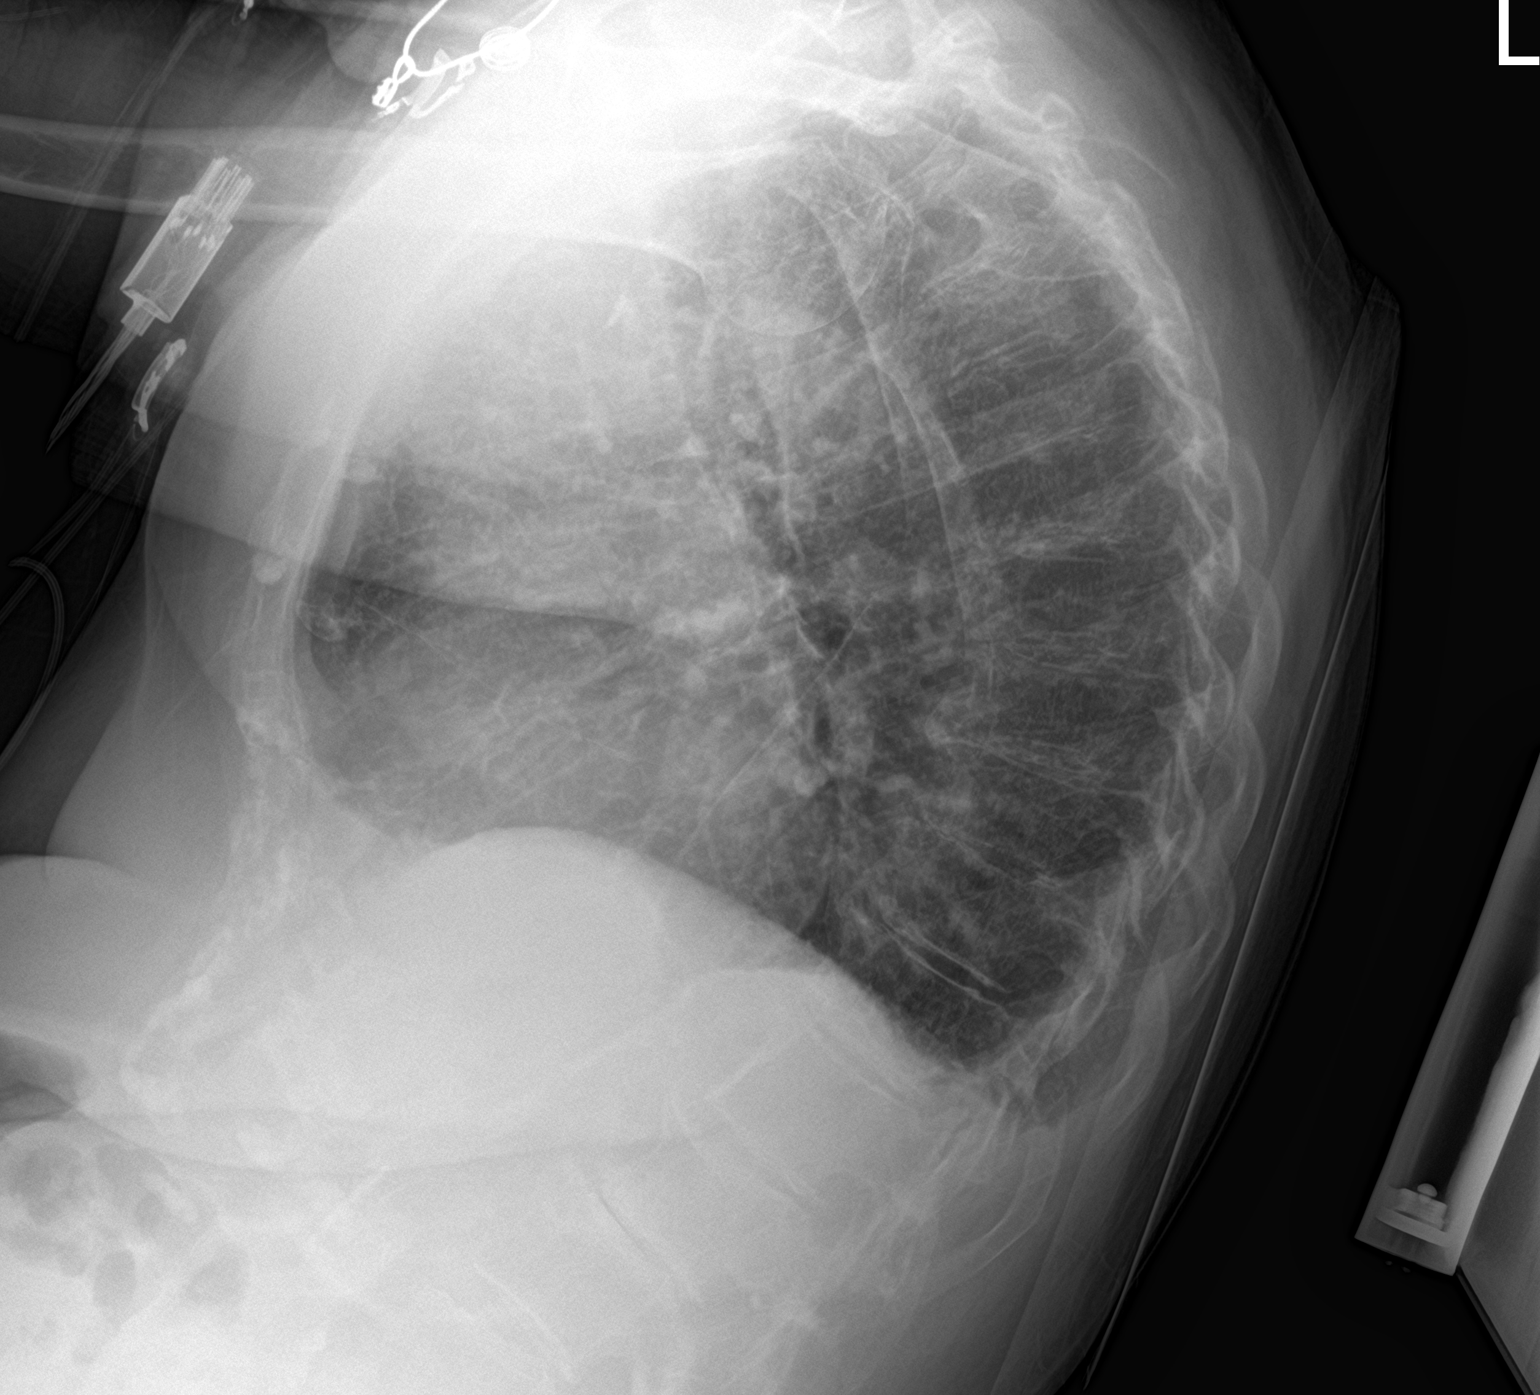

[chest ap]
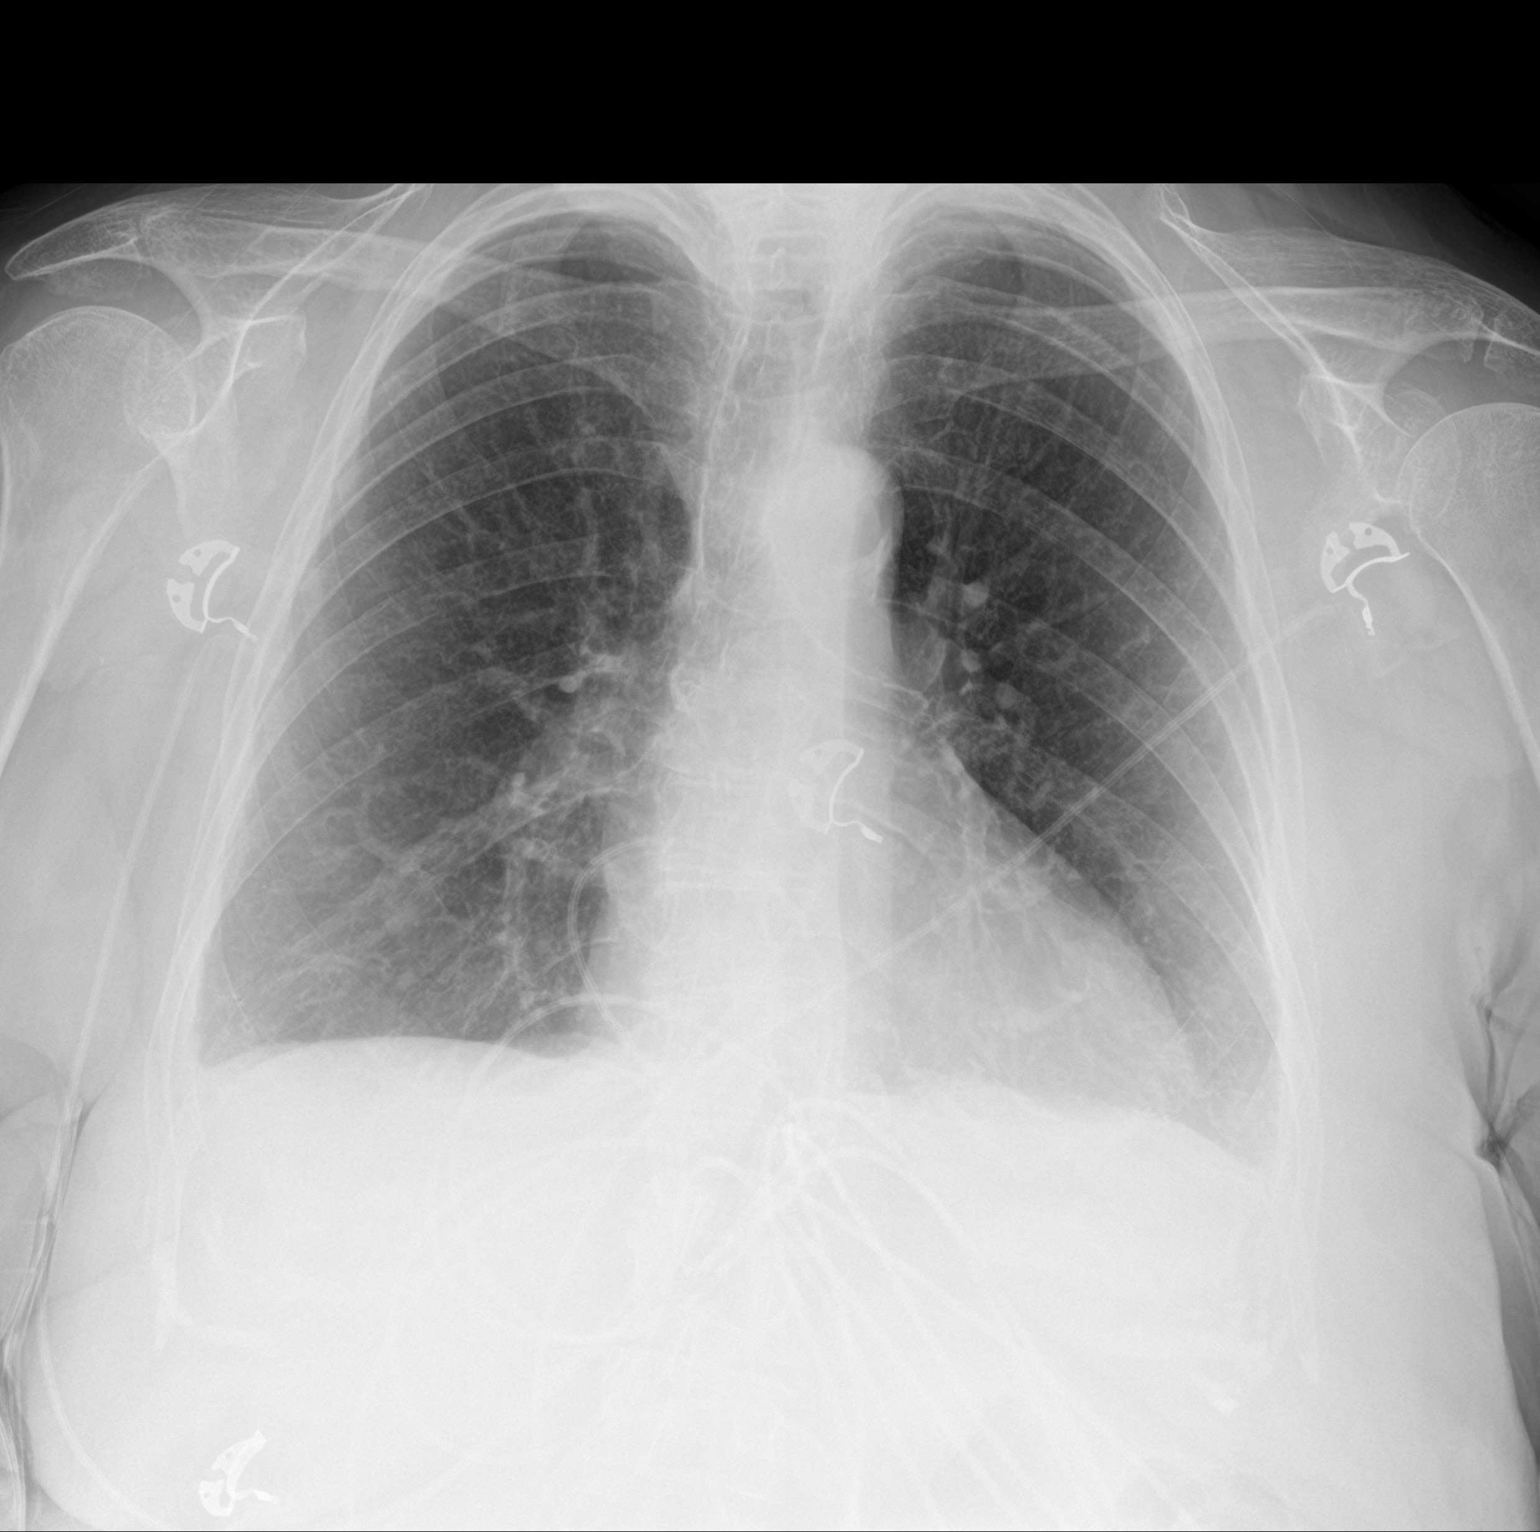

[2 of 2 positions shown; findings below may reference images not displayed]

FINDINGS: Previously seen masslike density on chest x-ray is not [place] out on
today's exam and was not visualized on recent CT. Cardiac shadow is
stable. Aortic calcifications are noted. Previously described left
rib fractures are seen but less well visualized than on prior exam.
No compression deformity is noted.
IMPRESSION: No acute abnormality noted. Known left rib fractures are not as well
visualized on today's exam.

## 2022-05-19 ENCOUNTER — Encounter (HOSPITAL_BASED_OUTPATIENT_CLINIC_OR_DEPARTMENT_OTHER): Payer: Medicare Other | Admitting: General Surgery

## 2023-04-06 ENCOUNTER — Emergency Department (HOSPITAL_COMMUNITY)
Admission: EM | Admit: 2023-04-06 | Discharge: 2023-04-26 | Disposition: E | Payer: Medicare Other | Attending: Emergency Medicine | Admitting: Emergency Medicine

## 2023-04-06 DIAGNOSIS — I469 Cardiac arrest, cause unspecified: Secondary | ICD-10-CM | POA: Diagnosis present

## 2023-04-06 DIAGNOSIS — I10 Essential (primary) hypertension: Secondary | ICD-10-CM | POA: Diagnosis not present

## 2023-04-06 MED ORDER — EPINEPHRINE 1 MG/10ML IJ SOSY
PREFILLED_SYRINGE | INTRAMUSCULAR | Status: DC | PRN
  Administered 2023-04-06 (×2): 1 mg via INTRAVENOUS

## 2023-04-26 NOTE — ED Notes (Signed)
1509- pulse check/ resumed CPR  1509- epi  1512- pulse check/ cpr RESUMED  1513- epi given  1515- pulse check, TOD 1515 by Dr. Jacqulyn Bath

## 2023-04-26 NOTE — ED Triage Notes (Signed)
Pt BIB EMS due to witnessed aresst by son. Pt was in sons car and coded. PEA on EMS arrival. HR in 30s. EMS got pulses back for 4 minutes. A total of 25 min CPR with EMS. EMS gave 4 epis, 500 cc of fluid.

## 2023-04-26 NOTE — ED Provider Notes (Signed)
[place] EMERGENCY DEPARTMENT AT [facility] Provider Note  MDM   HPI/ROS:  Cathy [name] is a 82 y.o. female with a medical history as below who presents via EMS as a witnessed cardiac arrest.  She was with her family member in the car, and suddenly became quiet and "turned blue."  Compressions were started and EMS was called, fire department arrived on scene within 3 minutes and continued compressions.  EMS arrived and placed an i-gel and started bagging, gave epi and achieved ROSC 1 time, but shortly lost pulses after that.  Patient was noted to be in a PEA rhythm per EMS.  Physical exam is notable for: - Patient was an extremis, pulseless and apneic - Pupils 4 mm nonreactive  On my initial evaluation, patient is:  -Pulseless, apneic  -Additional history obtained from EMS  She arrives with ongoing compressions via [place] device.  She has a size 3 iGel in place with bilateral breath sounds and no resistance with the respiratory bagging.  Patient was transferred over and ACLS protocol was continued.  At first pulse check, there were no carotid or femoral pulses.  Epinephrine was administered and CPR resumed.  Per EMS, the patient was a DNR, however the family did not have any DNR paperwork with them at the time that they arrived.  Attempted to call family to confirm, however unable to reach them via telephone.  At next pulse check, obtained a subxiphoid view of the heart, and there was no meaningful organized cardiac movement visible.  Some swirl sign present in the right ventricle.  She remained in PEA on the monitor at rate about 40. ACLS was resumed.  Patient was given a second round of epinephrine.   At next pulse check, the patient remained pulseless.  She had a PEA rhythm on the monitor at a rate of around 50.  Given the patient's expressed wishes to be a DNR and the fact that she remained pulseless and apneic despite 30 minutes of ACLS protocol.  Decision was made to  terminate any further resuscitative efforts.  Time of death was called at 1515.  Disposition:  Expired   Clinical Impression:  1. Cardiac arrest [facility])     Rx / DC Orders ED Discharge Orders     None       The plan for this patient was discussed with Dr. Jacqulyn Bath, who voiced agreement and who oversaw evaluation and treatment of this patient.   Clinical Complexity A medically appropriate history, review of systems, and physical exam was performed.  My independent interpretations of EKG, labs, and radiology are documented in the ED course above.   If decision rules were used in this patient's evaluation, they are listed below.   Click here for ABCD2, HEART and other calculatorsREFRESH Note before signing   Patient's presentation is most consistent with acute presentation with potential threat to life or bodily function.  Medical Decision Making Amount and/or Complexity of Data Reviewed Independent Historian: caregiver and EMS    Details: Son and EMS External Data Reviewed: notes.    Details: Prior outpatient patient  Risk Prescription drug management. Decision not to resuscitate or to de-escalate care because of poor prognosis.    HPI/ROS      See MDM section for pertinent HPI and ROS. A complete ROS was performed with pertinent positives/negatives noted above.   Past Medical History:  Diagnosis Date   Hypertension     Past Surgical History:  Procedure Laterality Date  NO PAST SURGERIES        Physical Exam   Vitals:   2023/05/03 1532  Weight: 59.6 kg  Height: 4\' 5"  (1.346 m)    Physical Exam Vitals reviewed: Abbreviated exam due to patient condition.  Constitutional:      Interventions: She is intubated. Backboard in place.  Eyes:     Pupils:     Right eye: Pupil is not reactive.     Left eye: Pupil is not reactive.  Neck:     Comments: Apneic Cardiovascular:     Pulses:          Carotid pulses are 0 on the right side and 0 on the left side.       Femoral pulses are 0 on the right side and 0 on the left side.    Comments: Pulseless Pulmonary:     Effort: She is intubated.  Neurological:     Mental Status: She is unresponsive.     GCS: GCS eye subscore is 1. GCS verbal subscore is 1. GCS motor subscore is 1.      Procedures   If procedures were preformed on this patient, they are listed below:  Procedures   Fayrene Helper, MD Emergency Medicine PGY-2   Please note that this documentation was produced with the assistance of voice-to-text technology and may contain errors.    Fayrene Helper, MD 03-May-2023 1728    Maia Plan, MD 04/11/23 1137

## 2023-04-26 DEATH — deceased
